# Patient Record
Sex: Female | Born: 1995
Health system: Southern US, Community
[De-identification: ages and names within clinical notes are randomized; demographics above are authoritative.]

## PROBLEM LIST (undated history)

## (undated) DIAGNOSIS — K219 Gastro-esophageal reflux disease without esophagitis: Secondary | ICD-10-CM

## (undated) DIAGNOSIS — E282 Polycystic ovarian syndrome: Secondary | ICD-10-CM

## (undated) DIAGNOSIS — K589 Irritable bowel syndrome without diarrhea: Secondary | ICD-10-CM

## (undated) DIAGNOSIS — B159 Hepatitis A without hepatic coma: Secondary | ICD-10-CM

## (undated) DIAGNOSIS — K9049 Malabsorption due to intolerance, not elsewhere classified: Secondary | ICD-10-CM

## (undated) HISTORY — DX: Gastro-esophageal reflux disease without esophagitis: K21.9

## (undated) HISTORY — DX: Malabsorption due to intolerance, not elsewhere classified: K90.49

## (undated) HISTORY — PX: OTHER SURGICAL HISTORY: SHX169

## (undated) HISTORY — DX: Polycystic ovarian syndrome: E28.2

## (undated) HISTORY — PX: WISDOM TOOTH EXTRACTION: SHX21

## (undated) HISTORY — PX: EYE SURGERY: SHX253

---
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
## 2016-02-11 ENCOUNTER — Encounter: Payer: Self-pay | Admitting: Pediatrics

## 2016-02-11 ENCOUNTER — Ambulatory Visit (INDEPENDENT_AMBULATORY_CARE_PROVIDER_SITE_OTHER): Payer: Commercial Managed Care - HMO | Admitting: Pediatrics

## 2016-02-11 VITALS — BP 116/76 | HR 104 | Temp 97.3°F | Ht 68.04 in | Wt 140.0 lb

## 2016-02-11 DIAGNOSIS — J069 Acute upper respiratory infection, unspecified: Secondary | ICD-10-CM

## 2016-02-11 DIAGNOSIS — J029 Acute pharyngitis, unspecified: Secondary | ICD-10-CM | POA: Diagnosis not present

## 2016-02-11 LAB — RAPID STREP SCREEN (MED CTR MEBANE ONLY): Strep Gp A Ag, IA W/Reflex: NEGATIVE

## 2016-02-11 LAB — CULTURE, GROUP A STREP

## 2016-02-11 NOTE — Progress Notes (Signed)
  Subjective:   Patient ID: Brianna Bridges, female    DOB: 1995-11-17, 21 y.o.   MRN: [id number] CC: Sore Throat  HPI: Brianna Bridges is a 21 y.o. female presenting for Sore Throat  Started getting sick yesterday evening Congestion, sore throat A little coughing No fevers Appetite ok  Relevant past medical, surgical, family and social history reviewed. Allergies and medications reviewed and updated. History  Smoking Status  . Never Smoker  Smokeless Tobacco  . Never Used   ROS: Per HPI   Objective:    BP 116/76   Pulse (!) 104   Temp 97.3 F (36.3 C) (Oral)   Ht 5' 8.04" (1.728 m)   Wt 140 lb (63.5 kg)   BMI 21.26 kg/m   Wt Readings from Last 3 Encounters:  02/11/16 140 lb (63.5 kg) (69 %, Z= 0.49)*  09/02/15 139 lb (63 kg) (69 %, Z= 0.48)*  08/26/15 140 lb (63.5 kg) (70 %, Z= 0.52)*   * Growth percentiles are based on CDC 2-20 Years data.    Gen: NAD, alert, cooperative with exam, NCAT, congested EYES: EOMI, no conjunctival injection, or no icterus ENT:  TMs pearly gray with white scars b/l, OP with mild erythema LYMPH: no cervical LAD CV: NRRR, normal S1/S2, no murmur, distal pulses 2+ b/l Resp: CTABL, no wheezes, normal WOB Neuro: Alert and oriented  Assessment & Plan:  Brianna Bridges was seen today for sore throat.  Diagnoses and all orders for this visit:  Sore throat -     Rapid strep screen (not at [facility]) -     Culture, Group A Strep  Acute URI Rapid strep neg Discussed symptomatic care, return precautions  Follow up plan: prn Assunta Found, MD [place]

## 2016-02-11 NOTE — Patient Instructions (Signed)
Netipot with distilled water 2-3 times a day to clear out sinuses Or Normal saline nasal spray Flonase steroid nasal spray Cetirizine or similar anti-histamine Ibuprofen 600mg  three times a day Lots of fluids

## 2016-02-13 LAB — CULTURE, GROUP A STREP: Strep A Culture: NEGATIVE

## 2016-02-15 DIAGNOSIS — Z6822 Body mass index (BMI) 22.0-22.9, adult: Secondary | ICD-10-CM | POA: Diagnosis not present

## 2016-02-15 DIAGNOSIS — Z01419 Encounter for gynecological examination (general) (routine) without abnormal findings: Secondary | ICD-10-CM | POA: Diagnosis not present

## 2016-04-15 ENCOUNTER — Ambulatory Visit: Payer: Commercial Managed Care - HMO | Admitting: Family

## 2016-04-15 ENCOUNTER — Encounter: Payer: Self-pay | Admitting: Physician Assistant

## 2016-04-15 ENCOUNTER — Ambulatory Visit (INDEPENDENT_AMBULATORY_CARE_PROVIDER_SITE_OTHER): Payer: Commercial Managed Care - HMO | Admitting: Physician Assistant

## 2016-04-15 VITALS — BP 122/78 | HR 86 | Temp 98.5°F | Ht 68.04 in | Wt 142.8 lb

## 2016-04-15 DIAGNOSIS — H6690 Otitis media, unspecified, unspecified ear: Secondary | ICD-10-CM

## 2016-04-15 DIAGNOSIS — H5712 Ocular pain, left eye: Secondary | ICD-10-CM | POA: Diagnosis not present

## 2016-04-15 MED ORDER — FLUTICASONE PROPIONATE 50 MCG/ACT NA SUSP: 2.0000 | Freq: Every day | NASAL | 6 refills | Status: DC

## 2016-04-15 MED ORDER — AMOXICILLIN 500 MG PO CAPS: 1000.0000 mg | ORAL_CAPSULE | Freq: Two times a day (BID) | ORAL | 0 refills | Status: DC

## 2016-04-15 NOTE — Patient Instructions (Signed)
Otitis Media, Adult Otitis media is redness, soreness, and puffiness (swelling) in the space just behind your eardrum (middle ear). It may be caused by allergies or infection. It often happens along with a cold. Follow these instructions at home:  Take your medicine as told. Finish it even if you start to feel better.  Only take over-the-counter or prescription medicines for pain, discomfort, or fever as told by your doctor.  Follow up with your doctor as told. Contact a doctor if:  You have otitis media only in one ear, or bleeding from your nose, or both.  You notice a lump on your neck.  You are not getting better in 3-5 days.  You feel worse instead of better. Get help right away if:  You have pain that is not helped with medicine.  You have puffiness, redness, or pain around your ear.  You get a stiff neck.  You cannot move part of your face (paralysis).  You notice that the bone behind your ear hurts when you touch it. This information is not intended to replace advice given to you by your health care provider. Make sure you discuss any questions you have with your health care provider. Document Released: 07/13/2007 Document Revised: 07/02/2015 Document Reviewed: 08/21/2012 Elsevier Interactive Patient Education  2017 Elsevier Inc.  

## 2016-04-15 NOTE — Progress Notes (Signed)
BP 122/78   Pulse 86   Temp 98.5 F (36.9 C) (Oral)   Ht 5' 8.04" (1.728 m)   Wt 142 lb 12.8 oz (64.8 kg)   BMI 21.69 kg/m    Subjective:    Patient ID: Brianna Bridges, female    DOB: [birth year], 21 y.o.   MRN: [id number]  HPI: Brianna Bridges is a 21 y.o. female presenting on 04/15/2016 for Ear Pain (bilateral)  Almost one week of increasing bilateral ear pain, decreased hearing. Some sinus pressure. Denies nausea, vomiting, fever or chills.  Decreased hearing in both ears too.  Relevant past medical, surgical, family and social history reviewed and updated as indicated. Allergies and medications reviewed and updated.  History reviewed. No pertinent past medical history.  Past Surgical History:  Procedure Laterality Date  . EYE SURGERY    . Foot surgery     Plantars wart  . WISDOM TOOTH EXTRACTION      Review of Systems  Constitutional: Positive for chills and fatigue. Negative for activity change and appetite change.  HENT: Positive for congestion, ear pain, postnasal drip, sore throat and tinnitus. Negative for ear discharge.   Eyes: Negative.   Respiratory: Negative for cough and wheezing.   Cardiovascular: Negative.  Negative for chest pain, palpitations and leg swelling.  Gastrointestinal: Negative.   Genitourinary: Negative.   Musculoskeletal: Negative.   Skin: Negative.   Neurological: Positive for headaches.    Allergies as of 04/15/2016   No Known Allergies     Medication List       Accurate as of 04/15/16  5:23 PM. Always use your most recent med list.          amoxicillin 500 MG capsule Commonly known as:  AMOXIL Take 2 capsules (1,000 mg total) by mouth 2 (two) times daily.   fluticasone 50 MCG/ACT nasal spray Commonly known as:  FLONASE Place 2 sprays into both nostrils daily.   MELODETTA 24 FE 1-20 MG-MCG(24) Chew Generic drug:  Norethin Ace-Eth Estrad-FE          Objective:    BP 122/78   Pulse 86   Temp 98.5 F (36.9 C)  (Oral)   Ht 5' 8.04" (1.728 m)   Wt 142 lb 12.8 oz (64.8 kg)   BMI 21.69 kg/m   No Known Allergies  Physical Exam  Constitutional: She is oriented to person, place, and time. She appears well-developed and well-nourished.  HENT:  Head: Normocephalic and atraumatic.  Right Ear: External ear normal. Tympanic membrane is erythematous. A middle ear effusion is present.  Left Ear: External ear normal. Tympanic membrane is erythematous. A middle ear effusion is present.  Nose: Mucosal edema and rhinorrhea present. Right sinus exhibits no maxillary sinus tenderness. Left sinus exhibits no maxillary sinus tenderness.  Mouth/Throat: Uvula is midline. Posterior oropharyngeal erythema present.  Eyes: Conjunctivae and EOM are normal. Pupils are equal, round, and reactive to light. Right eye exhibits no discharge. Left eye exhibits no discharge.  Neck: Normal range of motion.  Cardiovascular: Normal rate, regular rhythm and normal heart sounds.   Pulmonary/Chest: Effort normal and breath sounds normal. No respiratory distress. She has no wheezes.  Abdominal: Soft.  Lymphadenopathy:    She has no cervical adenopathy.  Neurological: She is alert and oriented to person, place, and time.  Skin: Skin is warm and dry.  Psychiatric: She has a normal mood and affect.  Nursing note and vitals reviewed.       Assessment &  Plan:   1. Acute otitis media, unspecified otitis media type - fluticasone (FLONASE) 50 MCG/ACT nasal spray; Place 2 sprays into both nostrils daily.  Dispense: 16 g; Refill: 6 - amoxicillin (AMOXIL) 500 MG capsule; Take 2 capsules (1,000 mg total) by mouth 2 (two) times daily.  Dispense: 40 capsule; Refill: 0   Continue all other maintenance medications as listed above.  Follow up plan: Return if symptoms worsen or fail to improve.  Educational handout given for otitis media  Terald Sleeper PA-C [facility] [address]  [place], [place]  [address] [phone]   04/15/2016, 5:23 PM

## 2016-05-11 ENCOUNTER — Telehealth: Payer: Self-pay | Admitting: Family

## 2016-05-11 NOTE — Telephone Encounter (Signed)
Printed and faxed

## 2016-05-30 ENCOUNTER — Telehealth: Payer: Self-pay | Admitting: Family

## 2016-05-31 NOTE — Telephone Encounter (Signed)
Shot record faxed to [phone] Attn: Lemmie Evens

## 2016-06-08 ENCOUNTER — Encounter: Payer: Self-pay | Admitting: Family Medicine

## 2016-06-08 ENCOUNTER — Ambulatory Visit (INDEPENDENT_AMBULATORY_CARE_PROVIDER_SITE_OTHER): Payer: Commercial Managed Care - HMO | Admitting: Family Medicine

## 2016-06-08 VITALS — BP 125/78 | HR 97 | Temp 97.4°F | Ht 68.04 in | Wt 147.0 lb

## 2016-06-08 DIAGNOSIS — J329 Chronic sinusitis, unspecified: Secondary | ICD-10-CM | POA: Diagnosis not present

## 2016-06-08 DIAGNOSIS — J40 Bronchitis, not specified as acute or chronic: Secondary | ICD-10-CM

## 2016-06-08 MED ORDER — HYDROCODONE-HOMATROPINE 5-1.5 MG/5ML PO SYRP: 5.0000 mL | ORAL_SOLUTION | Freq: Four times a day (QID) | ORAL | 0 refills | Status: DC | PRN

## 2016-06-08 MED ORDER — BETAMETHASONE SOD PHOS & ACET 6 (3-3) MG/ML IJ SUSP
6.0000 mg | Freq: Once | INTRAMUSCULAR | Status: AC
  Administered 2016-06-08: 6 mg via INTRAMUSCULAR

## 2016-06-08 MED ORDER — LEVOFLOXACIN 500 MG PO TABS: 500.0000 mg | ORAL_TABLET | Freq: Every day | ORAL | 0 refills | Status: DC

## 2016-06-08 NOTE — Progress Notes (Signed)
Subjective:  Patient ID: Brianna Bridges, female    DOB: May 27, 1995  Age: 21 y.o. MRN: [id number]  CC: Cough (pt here today c/o cough and congestion)   HPI CARLISIA GENO presents for Patient presents with upper respiratory congestion. There is moderate sore throat. Patient reports Profuse coughing frequently as well. It is keeping her awake. Estimates 3 hours of sleep a night for the last few nights. Has been given benzonatate and an inhaler recently by a physician near her college. Yellow sputum noted. No fever, chills or sweats. The patient denies being short of breath. Onset was 2 weeks ago. Gradually worsening. Tried OTCs without improvement.   History Akaysha has no past medical history on file.   She has a past surgical history that includes Eye surgery; Foot surgery; and Wisdom tooth extraction.   Her family history is not on file.She reports that she has never smoked. She has never used smokeless tobacco. She reports that she does not drink alcohol or use drugs.  Current Outpatient Prescriptions on File Prior to Visit  Medication Sig Dispense Refill  . MELODETTA 24 FE 1-20 MG-MCG(24) CHEW     . fluticasone (FLONASE) 50 MCG/ACT nasal spray Place 2 sprays into both nostrils daily. (Patient not taking: Reported on 06/08/2016) 16 g 6   No current facility-administered medications on file prior to visit.     ROS Review of Systems  Constitutional: Negative for appetite change, chills, diaphoresis, fatigue and fever.  HENT: Positive for congestion. Negative for ear pain (recent infection cleared with antibiotics prior to onset of these symptoms), hearing loss, postnasal drip, rhinorrhea, sore throat and trouble swallowing.   Respiratory: Positive for cough. Negative for chest tightness and shortness of breath.   Cardiovascular: Negative for chest pain and palpitations.  Gastrointestinal: Negative for abdominal pain.  Musculoskeletal: Negative for arthralgias.  Skin: Negative  for rash.    Objective:  BP 125/78   Pulse 97   Temp 97.4 F (36.3 C) (Oral)   Ht 5' 8.04" (1.728 m)   Wt 147 lb (66.7 kg)   BMI 22.33 kg/m   Physical Exam  Constitutional: She appears well-developed and well-nourished.  HENT:  Head: Normocephalic and atraumatic.  Right Ear: Tympanic membrane and external ear normal. No decreased hearing is noted.  Left Ear: Tympanic membrane and external ear normal. No decreased hearing is noted.  Nose: Mucosal edema present. Right sinus exhibits no frontal sinus tenderness. Left sinus exhibits no frontal sinus tenderness.  Mouth/Throat: No oropharyngeal exudate or posterior oropharyngeal erythema.  Neck: No Brudzinski's sign noted.  Cardiovascular: Normal rate and regular rhythm.   No murmur heard. Pulmonary/Chest: Effort normal. No respiratory distress. She has wheezes.  Lymphadenopathy:       Head (right side): No preauricular adenopathy present.       Head (left side): No preauricular adenopathy present.       Right cervical: No superficial cervical adenopathy present.      Left cervical: No superficial cervical adenopathy present.    Assessment & Plan:   Kylin was seen today for cough.  Diagnoses and all orders for this visit:  Sinobronchitis -     betamethasone acetate-betamethasone sodium phosphate (CELESTONE) injection 6 mg; Inject 1 mL (6 mg total) into the muscle once.  Other orders -     levofloxacin (LEVAQUIN) 500 MG tablet; Take 1 tablet (500 mg total) by mouth daily. -     HYDROcodone-homatropine (HYCODAN) 5-1.5 MG/5ML syrup; Take 5 mLs by mouth every  6 (six) hours as needed for cough.   I have discontinued Ms. Sparling's amoxicillin. I am also having her start on levofloxacin and HYDROcodone-homatropine. Additionally, I am having her maintain her MELODETTA 24 FE and fluticasone. We will continue to administer betamethasone acetate-betamethasone sodium phosphate.  Meds ordered this encounter  Medications  .  levofloxacin (LEVAQUIN) 500 MG tablet    Sig: Take 1 tablet (500 mg total) by mouth daily.    Dispense:  7 tablet    Refill:  0  . HYDROcodone-homatropine (HYCODAN) 5-1.5 MG/5ML syrup    Sig: Take 5 mLs by mouth every 6 (six) hours as needed for cough.    Dispense:  120 mL    Refill:  0  . betamethasone acetate-betamethasone sodium phosphate (CELESTONE) injection 6 mg     Follow-up: Return if symptoms worsen or fail to improve.  Claretta Fraise, M.D.

## 2016-06-27 ENCOUNTER — Ambulatory Visit (INDEPENDENT_AMBULATORY_CARE_PROVIDER_SITE_OTHER): Payer: Commercial Managed Care - HMO | Admitting: *Deleted

## 2016-06-27 DIAGNOSIS — Z111 Encounter for screening for respiratory tuberculosis: Secondary | ICD-10-CM | POA: Diagnosis not present

## 2016-06-27 DIAGNOSIS — Z23 Encounter for immunization: Secondary | ICD-10-CM

## 2016-06-27 NOTE — Progress Notes (Signed)
PPD placed L forearm Pt tolerated well 

## 2016-06-29 LAB — TB SKIN TEST
Induration: 0 mm
TB Skin Test: NEGATIVE

## 2016-07-19 DIAGNOSIS — H1033 Unspecified acute conjunctivitis, bilateral: Secondary | ICD-10-CM | POA: Diagnosis not present

## 2016-07-19 DIAGNOSIS — H10413 Chronic giant papillary conjunctivitis, bilateral: Secondary | ICD-10-CM | POA: Diagnosis not present

## 2016-08-01 DIAGNOSIS — L700 Acne vulgaris: Secondary | ICD-10-CM | POA: Diagnosis not present

## 2016-09-21 ENCOUNTER — Ambulatory Visit (INDEPENDENT_AMBULATORY_CARE_PROVIDER_SITE_OTHER): Payer: 59 | Admitting: Pediatrics

## 2016-09-21 ENCOUNTER — Encounter: Payer: Self-pay | Admitting: Pediatrics

## 2016-09-21 VITALS — BP 120/80 | HR 84 | Temp 99.4°F | Ht 68.04 in | Wt 140.0 lb

## 2016-09-21 DIAGNOSIS — K602 Anal fissure, unspecified: Secondary | ICD-10-CM

## 2016-09-21 NOTE — Progress Notes (Signed)
  Subjective:   Patient ID: Brianna Bridges, female    DOB: [birth year], 21 y.o.   MRN: [id number] CC: Hemorrhoids  HPI: Brianna Bridges is a 21 y.o. female presenting for Hemorrhoids  Started having pain about three months ago Had constipation when younger, had to use suppositories Having pain with passing stool In the last few days has had more                                                               Small spots of blood on toilet paper when she wipes Has pain with passage of stool, feelings like stool "ripping or tearing skin" at times No h/o pregnancy                                                    Relevant past medical, surgical, family and social history reviewed. Allergies and medications reviewed and updated. History  Smoking Status  . Never Smoker  Smokeless Tobacco  . Never Used   ROS: Per HPI   Objective:    BP 120/80   Pulse 84   Temp 99.4 F (37.4 C) (Oral)   Ht 5' 8.04" (1.728 m)   Wt 140 lb (63.5 kg)   BMI 21.26 kg/m   Wt Readings from Last 3 Encounters:  09/21/16 140 lb (63.5 kg)  06/08/16 147 lb (66.7 kg)  04/15/16 142 lb 12.8 oz (64.8 kg)    Gen: NAD, alert, cooperative with exam, NCAT EYES: EOMI, no conjunctival injection, or no icterus CV:  distal pulses 2+ b/l Resp: normal WOB Abd: +BS, soft, NTND. no guarding or organomegaly Ext: No edema, warm Neuro: Alert and oriented MSK: normal muscle bulk Rectal: no external hemorrhoids  Assessment & Plan:  Brianna Bridges was seen today for hemorrhoids.  Diagnoses and all orders for this visit:  Anal fissure Don't take iron pills of birth control pack  Miralax every morning for the next week. Drink all at once. Make sure you are staying hydrated. At least one glass non-caffeinated fluids with each meal and between meals. For every caffeinated beverage drink extra glass of fluid.  Goal 1-2 soft pudding consistency stools in a day  If symptoms continue let us know  Follow up plan: Return if  symptoms worsen or fail to improve. Assunta Found, MD [place]

## 2016-09-21 NOTE — Patient Instructions (Signed)
Don't take iron pills of birth control pack  Miralax every morning for the next week. Drink all at once. Make sure you are staying hydrated. At least one glass non-caffeinated fluids with each meal and between meals. For every caffeinated beverage drink extra glass of fluid.  Goal 1-2 soft pudding consistency stools in a day

## 2016-11-02 DIAGNOSIS — L700 Acne vulgaris: Secondary | ICD-10-CM | POA: Diagnosis not present

## 2016-11-02 DIAGNOSIS — B078 Other viral warts: Secondary | ICD-10-CM | POA: Diagnosis not present

## 2017-01-11 DIAGNOSIS — J069 Acute upper respiratory infection, unspecified: Secondary | ICD-10-CM | POA: Diagnosis not present

## 2017-02-03 DIAGNOSIS — D1800 Hemangioma unspecified site: Secondary | ICD-10-CM | POA: Diagnosis not present

## 2017-02-03 DIAGNOSIS — L814 Other melanin hyperpigmentation: Secondary | ICD-10-CM | POA: Diagnosis not present

## 2017-02-03 DIAGNOSIS — D225 Melanocytic nevi of trunk: Secondary | ICD-10-CM | POA: Diagnosis not present

## 2017-02-23 DIAGNOSIS — J020 Streptococcal pharyngitis: Secondary | ICD-10-CM | POA: Diagnosis not present

## 2017-03-10 DIAGNOSIS — R070 Pain in throat: Secondary | ICD-10-CM | POA: Diagnosis not present

## 2017-03-11 DIAGNOSIS — R070 Pain in throat: Secondary | ICD-10-CM | POA: Diagnosis not present

## 2017-03-29 DIAGNOSIS — R509 Fever, unspecified: Secondary | ICD-10-CM | POA: Diagnosis not present

## 2017-03-29 DIAGNOSIS — J029 Acute pharyngitis, unspecified: Secondary | ICD-10-CM | POA: Diagnosis not present

## 2017-03-29 DIAGNOSIS — R0989 Other specified symptoms and signs involving the circulatory and respiratory systems: Secondary | ICD-10-CM | POA: Diagnosis not present

## 2017-03-29 DIAGNOSIS — J020 Streptococcal pharyngitis: Secondary | ICD-10-CM | POA: Diagnosis not present

## 2017-04-07 DIAGNOSIS — Z6822 Body mass index (BMI) 22.0-22.9, adult: Secondary | ICD-10-CM | POA: Diagnosis not present

## 2017-04-07 DIAGNOSIS — Z01419 Encounter for gynecological examination (general) (routine) without abnormal findings: Secondary | ICD-10-CM | POA: Diagnosis not present

## 2017-04-21 DIAGNOSIS — J039 Acute tonsillitis, unspecified: Secondary | ICD-10-CM | POA: Diagnosis not present

## 2017-04-21 DIAGNOSIS — J029 Acute pharyngitis, unspecified: Secondary | ICD-10-CM | POA: Diagnosis not present

## 2017-05-06 DIAGNOSIS — J020 Streptococcal pharyngitis: Secondary | ICD-10-CM | POA: Diagnosis not present

## 2017-05-06 DIAGNOSIS — J029 Acute pharyngitis, unspecified: Secondary | ICD-10-CM | POA: Diagnosis not present

## 2017-05-24 DIAGNOSIS — J0391 Acute recurrent tonsillitis, unspecified: Secondary | ICD-10-CM

## 2017-05-24 HISTORY — DX: Acute recurrent tonsillitis, unspecified: J03.91

## 2017-06-07 ENCOUNTER — Other Ambulatory Visit: Payer: Self-pay | Admitting: Family Medicine

## 2017-06-07 ENCOUNTER — Telehealth: Payer: Self-pay | Admitting: Family Medicine

## 2017-06-07 DIAGNOSIS — Z1329 Encounter for screening for other suspected endocrine disorder: Secondary | ICD-10-CM

## 2017-06-07 DIAGNOSIS — Z114 Encounter for screening for human immunodeficiency virus [HIV]: Secondary | ICD-10-CM

## 2017-06-07 DIAGNOSIS — Z130 Encounter for screening for diseases of the blood and blood-forming organs and certain disorders involving the immune mechanism: Secondary | ICD-10-CM

## 2017-06-07 DIAGNOSIS — Z13228 Encounter for screening for other metabolic disorders: Secondary | ICD-10-CM

## 2017-06-07 NOTE — Telephone Encounter (Signed)
Pt is scheduled to have CPE on 5/7 with Dr Arnetha Gula, mom has requested we have orders in for pt to come in 5/3 to get labs drawn, mom states that pt has been sickly for last 3 months and a few visits she has and an escalated BP.

## 2017-06-07 NOTE — Telephone Encounter (Signed)
Will place basic labs CMP, CBC, TSH, HIV screening placed.  Difficult to tell what patient will actually need without seeing her first but this should cover most of our bases. Will plan for pap smear at that visit as well.

## 2017-06-08 ENCOUNTER — Other Ambulatory Visit: Payer: 59

## 2017-06-08 DIAGNOSIS — Z13228 Encounter for screening for other metabolic disorders: Secondary | ICD-10-CM

## 2017-06-08 DIAGNOSIS — Z1329 Encounter for screening for other suspected endocrine disorder: Secondary | ICD-10-CM | POA: Diagnosis not present

## 2017-06-08 DIAGNOSIS — Z114 Encounter for screening for human immunodeficiency virus [HIV]: Secondary | ICD-10-CM

## 2017-06-08 DIAGNOSIS — M9901 Segmental and somatic dysfunction of cervical region: Secondary | ICD-10-CM | POA: Diagnosis not present

## 2017-06-08 DIAGNOSIS — Z130 Encounter for screening for diseases of the blood and blood-forming organs and certain disorders involving the immune mechanism: Secondary | ICD-10-CM

## 2017-06-08 DIAGNOSIS — M62830 Muscle spasm of back: Secondary | ICD-10-CM | POA: Diagnosis not present

## 2017-06-08 NOTE — Telephone Encounter (Signed)
Pt's mother notified of lab order

## 2017-06-09 LAB — CBC WITH DIFFERENTIAL/PLATELET
BASOS ABS: 0 10*3/uL (ref 0.0–0.2)
Basos: 0 %
EOS (ABSOLUTE): 0.1 10*3/uL (ref 0.0–0.4)
Eos: 2 %
Hematocrit: 39.3 % (ref 34.0–46.6)
Hemoglobin: 13.1 g/dL (ref 11.1–15.9)
IMMATURE GRANS (ABS): 0 10*3/uL (ref 0.0–0.1)
IMMATURE GRANULOCYTES: 0 %
Lymphocytes Absolute: 1.1 10*3/uL (ref 0.7–3.1)
Lymphs: 18 %
MCH: 28.9 pg (ref 26.6–33.0)
MCHC: 33.3 g/dL (ref 31.5–35.7)
MCV: 87 fL (ref 79–97)
Monocytes Absolute: 0.5 10*3/uL (ref 0.1–0.9)
Monocytes: 8 %
NEUTROS PCT: 72 %
Neutrophils Absolute: 4.4 10*3/uL (ref 1.4–7.0)
Platelets: 276 10*3/uL (ref 150–379)
RBC: 4.54 x10E6/uL (ref 3.77–5.28)
RDW: 13.1 % (ref 12.3–15.4)
WBC: 6.1 10*3/uL (ref 3.4–10.8)

## 2017-06-09 LAB — CMP14+EGFR
ALT: 12 IU/L (ref 0–32)
AST: 17 IU/L (ref 0–40)
Albumin/Globulin Ratio: 1.9 (ref 1.2–2.2)
Albumin: 4.3 g/dL (ref 3.5–5.5)
Alkaline Phosphatase: 34 IU/L — ABNORMAL LOW (ref 39–117)
BUN/Creatinine Ratio: 17 (ref 9–23)
BUN: 14 mg/dL (ref 6–20)
Bilirubin Total: 0.3 mg/dL (ref 0.0–1.2)
CALCIUM: 9.1 mg/dL (ref 8.7–10.2)
CO2: 22 mmol/L (ref 20–29)
Chloride: 104 mmol/L (ref 96–106)
Creatinine, Ser: 0.81 mg/dL (ref 0.57–1.00)
GFR, EST AFRICAN AMERICAN: 120 mL/min/{1.73_m2} (ref >59)
GFR, EST NON AFRICAN AMERICAN: 104 mL/min/{1.73_m2} (ref >59)
GLUCOSE: 88 mg/dL (ref 65–99)
Globulin, Total: 2.3 g/dL (ref 1.5–4.5)
Potassium: 4.1 mmol/L (ref 3.5–5.2)
Sodium: 141 mmol/L (ref 134–144)
TOTAL PROTEIN: 6.6 g/dL (ref 6.0–8.5)

## 2017-06-09 LAB — HIV ANTIBODY (ROUTINE TESTING W REFLEX): HIV SCREEN 4TH GENERATION: NONREACTIVE

## 2017-06-09 LAB — TSH: TSH: 2.29 u[IU]/mL (ref 0.450–4.500)

## 2017-06-12 DIAGNOSIS — M9901 Segmental and somatic dysfunction of cervical region: Secondary | ICD-10-CM | POA: Diagnosis not present

## 2017-06-12 DIAGNOSIS — M62830 Muscle spasm of back: Secondary | ICD-10-CM | POA: Diagnosis not present

## 2017-06-13 ENCOUNTER — Ambulatory Visit (INDEPENDENT_AMBULATORY_CARE_PROVIDER_SITE_OTHER): Payer: 59 | Admitting: Family Medicine

## 2017-06-13 ENCOUNTER — Encounter: Payer: Self-pay | Admitting: Family Medicine

## 2017-06-13 VITALS — BP 130/83 | HR 90 | Temp 98.5°F | Ht 68.0 in | Wt 141.0 lb

## 2017-06-13 DIAGNOSIS — Z0000 Encounter for general adult medical examination without abnormal findings: Secondary | ICD-10-CM

## 2017-06-13 DIAGNOSIS — J301 Allergic rhinitis due to pollen: Secondary | ICD-10-CM

## 2017-06-13 NOTE — Progress Notes (Signed)
Brianna Bridges is a 22 y.o. female presents to office today for annual physical exam examination.    Concerns today include: 1. Allergies Patient reports a 2-week history of dry cough, runny nose.  She denies any fevers, chills, sore throat.  She notes that several of her roommates had similar symptoms but they resolved.  She reports a history of recurrent tonsillitis for which she is seeing ear nose and throat.  She took an allergy pill this morning but is not sure if this is working just yet.  No other therapies tried.  Occupation: Ship broker at [employer], Marital status: single, Substance use: none Diet: balanced, Exercise: regular Last eye exam: 1 year ago; does not require corrective lenses. Last dental exam: q6 months Last pap smear: 03/2017 w. Dr Brianna Bridges. Refills needed today: none Immunizations needed: UTD  No past medical history on file. Social History   Socioeconomic History  . Marital status: Single    Spouse name: Not on file  . Number of children: Not on file  . Years of education: Not on file  . Highest education level: Not on file  Occupational History  . Not on file  Social Needs  . Financial resource strain: Not on file  . Food insecurity:    Worry: Not on file    Inability: Not on file  . Transportation needs:    Medical: Not on file    Non-medical: Not on file  Tobacco Use  . Smoking status: Never Smoker  . Smokeless tobacco: Never Used  Substance and Sexual Activity  . Alcohol use: No  . Drug use: No  . Sexual activity: Not on file  Lifestyle  . Physical activity:    Days per week: Not on file    Minutes per session: Not on file  . Stress: Not on file  Relationships  . Social connections:    Talks on phone: Not on file    Gets together: Not on file    Attends religious service: Not on file    Active member of club or organization: Not on file    Attends meetings of clubs or organizations: Not on file    Relationship status: Not on file  .  Intimate partner violence:    Fear of current or ex partner: Not on file    Emotionally abused: Not on file    Physically abused: Not on file    Forced sexual activity: Not on file  Other Topics Concern  . Not on file  Social History Narrative  . Not on file   Past Surgical History:  Procedure Laterality Date  . EYE SURGERY    . Foot surgery     Plantars wart  . WISDOM TOOTH EXTRACTION     No family history on file.  Current Outpatient Medications:  .  Adapalene-Benzoyl Peroxide 0.1-2.5 % gel, APPLY AS DIRECTED ONCE A DAY, Disp: , Rfl: 3 .  MELODETTA 24 FE 1-20 MG-MCG(24) CHEW, , Disp: , Rfl:   No Known Allergies   ROS: Review of Systems Constitutional: negative Eyes: negative Ears, nose, mouth, throat, and face: positive for nasal congestion and rhinorrhea Respiratory: negative Cardiovascular: negative Gastrointestinal: negative Genitourinary:negative Integument/breast: negative Hematologic/lymphatic: negative Musculoskeletal:negative Neurological: negative Behavioral/Psych: negative Endocrine: negative Allergic/Immunologic: negative    Physical exam BP 130/83   Pulse 90   Temp 98.5 F (36.9 C) (Oral)   Ht 5\' 8"  (1.727 m)   Wt 141 lb (64 kg)   SpO2 98%   BMI  21.44 kg/m  General appearance: alert, cooperative, appears stated age and no distress Head: Normocephalic, without obvious abnormality, atraumatic Eyes: negative findings: lids and lashes normal, conjunctivae and sclerae normal, corneas clear and pupils equal, round, reactive to light and accomodation Ears: normal TM's and external ear canals both ears and scarring noted on bilateral TMs Nose: Nares normal. Septum midline. Mucosa normal. No drainage or sinus tenderness. Throat: lips, mucosa, and tongue normal; teeth and gums normal Neck: no adenopathy, supple, symmetrical, trachea midline and thyroid not enlarged, symmetric, no tenderness/mass/nodules Back: symmetric, no curvature. ROM normal. No CVA  tenderness. Lungs: clear to auscultation bilaterally Heart: regular rate and rhythm, S1, S2 normal, no murmur, click, rub or gallop Abdomen: soft, non-tender; bowel sounds normal; no masses,  no organomegaly Extremities: extremities normal, atraumatic, no cyanosis or edema Pulses: 2+ and symmetric Skin: Skin color, texture, turgor normal. No rashes or lesions Lymph nodes: Cervical, supraclavicular, and axillary nodes normal. Neurologic: Grossly normal  Psych: Mood stable, speech normal, affect appropriate, good eye contact, does not appear to be responding to internal stimuli. Depression screen [facility] 2/9 09/21/2016 06/08/2016 04/15/2016  Decreased Interest 0 0 0  Down, Depressed, Hopeless 0 0 0  PHQ - 2 Score 0 0 0    Assessment/ Plan: Brianna Bridges here for annual physical exam.   1. Well woman exam (no gynecological exam) Pap smear performed by Dr Brianna Bridges at [facility].  Will obtain result from office. TDap due 09/2017.  2. Seasonal allergic rhinitis due to pollen Flonase, Claritin recommended.  No evidence of infection on today's exam.  Home care instructions were reviewed.  Return precautions discussed.  Follow up as needed.  Counseled on healthy lifestyle choices, including diet (rich in fruits, vegetables and lean meats and low in salt and simple carbohydrates) and exercise (at least 30 minutes of moderate physical activity daily).  Patient to follow up in 1 year for annual exam or sooner if needed.  Brianna Goree M. Lajuana Ripple, DO

## 2017-06-13 NOTE — Patient Instructions (Addendum)
I think that your symptoms are related to seasonal allergic rhinitis.  We discussed using a daily antihistamine like Zyrtec or Claritin.  I would also recommend that you use Flonase nasal spray 2 sprays in each nostril daily.  As we discussed, it may take about 2 weeks before you really notice the full effect of these medications.  If you develop any signs or symptoms of infectious etiologies like fevers, chills, more severe sinus symptoms, please do not hesitate to contact me or come in for an appointment.  Plan to see me back in 1 year or sooner if needed.   Preventive Care 18-39 Years, Female Preventive care refers to lifestyle choices and visits with your health care provider that can promote health and wellness. What does preventive care include?  A yearly physical exam. This is also called an annual well check.  Dental exams once or twice a year.  Routine eye exams. Ask your health care provider how often you should have your eyes checked.  Personal lifestyle choices, including: ? Daily care of your teeth and gums. ? Regular physical activity. ? Eating a healthy diet. ? Avoiding tobacco and drug use. ? Limiting alcohol use. ? Practicing safe sex. ? Taking vitamin and mineral supplements as recommended by your health care provider. What happens during an annual well check? The services and screenings done by your health care provider during your annual well check will depend on your age, overall health, lifestyle risk factors, and family history of disease. Counseling Your health care provider may ask you questions about your:  Alcohol use.  Tobacco use.  Drug use.  Emotional well-being.  Home and relationship well-being.  Sexual activity.  Eating habits.  Work and work Statistician.  Method of birth control.  Menstrual cycle.  Pregnancy history.  Screening You may have the following tests or measurements:  Height, weight, and BMI.  Diabetes screening. This is  done by checking your blood sugar (glucose) after you have not eaten for a while (fasting).  Blood pressure.  Lipid and cholesterol levels. These may be checked every 5 years starting at age 76.  Skin check.  Hepatitis C blood test.  Hepatitis B blood test.  Sexually transmitted disease (STD) testing.  BRCA-related cancer screening. This may be done if you have a family history of breast, ovarian, tubal, or peritoneal cancers.  Pelvic exam and Pap test. This may be done every 3 years starting at age 51. Starting at age 65, this may be done every 5 years if you have a Pap test in combination with an HPV test.  Discuss your test results, treatment options, and if necessary, the need for more tests with your health care provider. Vaccines Your health care provider may recommend certain vaccines, such as:  Influenza vaccine. This is recommended every year.  Tetanus, diphtheria, and acellular pertussis (Tdap, Td) vaccine. You may need a Td booster every 10 years.  Varicella vaccine. You may need this if you have not been vaccinated.  HPV vaccine. If you are 22 or younger, you may need three doses over 6 months.  Measles, mumps, and rubella (MMR) vaccine. You may need at least one dose of MMR. You may also need a second dose.  Pneumococcal 13-valent conjugate (PCV13) vaccine. You may need this if you have certain conditions and were not previously vaccinated.  Pneumococcal polysaccharide (PPSV23) vaccine. You may need one or two doses if you smoke cigarettes or if you have certain conditions.  Meningococcal vaccine. One  dose is recommended if you are age 90-21 years and a first-year college student living in a residence hall, or if you have one of several medical conditions. You may also need additional booster doses.  Hepatitis A vaccine. You may need this if you have certain conditions or if you travel or work in places where you may be exposed to hepatitis A.  Hepatitis B  vaccine. You may need this if you have certain conditions or if you travel or work in places where you may be exposed to hepatitis B.  Haemophilus influenzae type b (Hib) vaccine. You may need this if you have certain risk factors.  Talk to your health care provider about which screenings and vaccines you need and how often you need them. This information is not intended to replace advice given to you by your health care provider. Make sure you discuss any questions you have with your health care provider. Document Released: 03/22/2001 Document Revised: 10/14/2015 Document Reviewed: 11/25/2014 Elsevier Interactive Patient Education  [employer].

## 2017-06-19 DIAGNOSIS — M62830 Muscle spasm of back: Secondary | ICD-10-CM | POA: Diagnosis not present

## 2017-06-19 DIAGNOSIS — M9901 Segmental and somatic dysfunction of cervical region: Secondary | ICD-10-CM | POA: Diagnosis not present

## 2017-06-22 DIAGNOSIS — M62830 Muscle spasm of back: Secondary | ICD-10-CM | POA: Diagnosis not present

## 2017-06-22 DIAGNOSIS — M9901 Segmental and somatic dysfunction of cervical region: Secondary | ICD-10-CM | POA: Diagnosis not present

## 2017-07-06 DIAGNOSIS — N6002 Solitary cyst of left breast: Secondary | ICD-10-CM | POA: Diagnosis not present

## 2017-07-06 DIAGNOSIS — N644 Mastodynia: Secondary | ICD-10-CM | POA: Diagnosis not present

## 2017-09-14 ENCOUNTER — Ambulatory Visit: Payer: 59

## 2017-09-18 ENCOUNTER — Ambulatory Visit (INDEPENDENT_AMBULATORY_CARE_PROVIDER_SITE_OTHER): Payer: 59 | Admitting: *Deleted

## 2017-09-18 ENCOUNTER — Ambulatory Visit: Payer: 59 | Admitting: Family Medicine

## 2017-09-18 ENCOUNTER — Encounter: Payer: Self-pay | Admitting: Family Medicine

## 2017-09-18 VITALS — BP 117/77 | HR 73 | Temp 97.3°F | Ht 68.0 in | Wt 142.0 lb

## 2017-09-18 DIAGNOSIS — Z23 Encounter for immunization: Secondary | ICD-10-CM

## 2017-09-18 DIAGNOSIS — B078 Other viral warts: Secondary | ICD-10-CM | POA: Diagnosis not present

## 2017-09-18 DIAGNOSIS — Z111 Encounter for screening for respiratory tuberculosis: Secondary | ICD-10-CM

## 2017-09-18 NOTE — Progress Notes (Signed)
Pt given PPD R forearm Tolerated well

## 2017-09-18 NOTE — Progress Notes (Signed)
Subjective: CC: warts PCP: Janora Norlander, DO VQQ:[id number] D Diffee is a 22 y.o. female presenting to clinic today for:  1. Warts Patient reports a long-standing history of issues with warts on her knees.  She saw her dermatologist last year who removed 1 of the right knee.  She notes that she had another one develop over the last several months on the right knee as well as 2 smaller ones on the left knee.  Denies any spontaneous bleeding.    ROS: Per HPI  No Known Allergies No past medical history on file.  Current Outpatient Medications:  Marland Kitchen  MELODETTA 24 FE 1-20 MG-MCG(24) CHEW, , Disp: , Rfl:  Social History   Socioeconomic History  . Marital status: Single    Spouse name: Not on file  . Number of children: Not on file  . Years of education: Not on file  . Highest education level: Not on file  Occupational History  . Not on file  Social Needs  . Financial resource strain: Not on file  . Food insecurity:    Worry: Not on file    Inability: Not on file  . Transportation needs:    Medical: Not on file    Non-medical: Not on file  Tobacco Use  . Smoking status: Never Smoker  . Smokeless tobacco: Never Used  Substance and Sexual Activity  . Alcohol use: No  . Drug use: No  . Sexual activity: Not on file  Lifestyle  . Physical activity:    Days per week: Not on file    Minutes per session: Not on file  . Stress: Not on file  Relationships  . Social connections:    Talks on phone: Not on file    Gets together: Not on file    Attends religious service: Not on file    Active member of club or organization: Not on file    Attends meetings of clubs or organizations: Not on file    Relationship status: Not on file  . Intimate partner violence:    Fear of current or ex partner: Not on file    Emotionally abused: Not on file    Physically abused: Not on file    Forced sexual activity: Not on file  Other Topics Concern  . Not on file  Social History Narrative   . Not on file   Family History  Problem Relation Age of Onset  . Hyperlipidemia Father     Objective: Office vital signs reviewed. BP 117/77   Pulse 73   Temp (!) 97.3 F (36.3 C) (Oral)   Ht 5\' 8"  (1.727 m)   Wt 142 lb (64.4 kg)   BMI 21.59 kg/m   Physical Examination:  General: Awake, alert, well nourished, No acute distress Extremities: warm, well perfused, No edema, cyanosis or clubbing; +2 pulses bilaterally Skin: dry; intact; scaly, wartlike lesion appreciated along the right anterior knee.  It is approximately 3 mm x 3 mm in size.  She has 2 much smaller and flatter lesions appreciated on the left anterior knee that are about 1 mm x 1 mm in size each.  Cryotherapy Procedure:  Risks and benefits of procedure were reviewed with the patient.  Written consent obtained and scanned into the chart.  Lesions of concern was identified and located on left knee.  Liquid nitrogen was applied to area of concern and extending out 0.5 millimeters beyond the border of the lesion.  Treated area was allowed to come  back to room temperature before treating it a second time.  Patient tolerated procedure well and there were no immediate complications.  Home care instructions were reviewed with the patient and a handout was provided.  Cryotherapy Procedure:  Risks and benefits of procedure were reviewed with the patient.  Written consent obtained and scanned into the chart.  Lesion of concern was identified and located on right knee.  Liquid nitrogen was applied to area of concern and extending out 0.5 millimeters beyond the border of the lesion.  Treated area was allowed to come back to room temperature before treating it a second time.  Patient tolerated procedure well and there were no immediate complications.  Home care instructions were reviewed with the patient and a handout was provided.   Assessment/ Plan: 22 y.o. female   1. Other viral warts Risks and benefits reviewed.  Signed  consent obtained.  Treated with cryotherapy today.  See above procedures.  Patient tolerated procedure well.  No immediate comp occasions.  Home care instructions were reviewed with the patient and handout was provided.  I also gave her handout on duct tape therapy.  Follow-up as needed.  2. Need for Tdap vaccination Tdap updated today.  Follow-up as needed.   Orders Placed This Encounter  Procedures  . Tdap vaccine greater than or equal to 7yo IM    Janora Norlander, DO [place] [phone]

## 2017-09-18 NOTE — Patient Instructions (Signed)
Cryosurgery for Skin Conditions, Care After This sheet gives you information about how to care for yourself after your procedure. Your health care provider may also give you more specific instructions. If you have problems or questions, contact your health care provider. What can I expect after the procedure? After your procedure, it is common to have redness, swelling, and a blister that forms over the treated area. The blister may contain a small amount of blood. After about 2 weeks, the blister will break on its own, leaving a scab. Then the treated area will heal. After healing, there is usually little or no scarring. Follow these instructions at home: Caring for the treated area  Follow instructions from your health care provider about how to take care of the treated area. Make sure you: ? Keep the area covered with a bandage (dressing) until it heals, or for as long as told by your health care provider. ? Wash your hands with soap and water before you change your dressing. If soap and water are not available, use hand sanitizer. ? Change your dressing as told by your health care provider. ? Keep the dressing and the treated area clean and dry. If the dressing gets wet, change it right away. ? Clean the treated area with soap and water.  Check the treated area every day for signs of infection. Check for: ? More redness, swelling, or pain. ? More fluid or blood. ? Warmth. ? Pus or a bad smell. General instructions  Do not pick at your blister or try to break it open. This can cause infection and scarring.  Do not apply any medicine, cream, or lotion to the treated area unless directed by your health care provider.  Take over-the-counter and prescription medicines only as told by your health care provider.  Keep all follow-up visits as told by your health care provider. This is important. Contact a health care provider if:  You have more redness, swelling, or pain around the treated  area.  You have more fluid or blood coming from the treated area.  The treated area feels warm to the touch.  You have pus or a bad smell coming from the treated area.  Your blister becomes large and painful. Get help right away if:  You have a fever and have redness spreading from the treated area. Summary  The treated area will become red and swollen shortly after the procedure.  You should keep the treated area and your dressing clean and dry.  Check the treated area every day for signs of infection, such as fluid, pus, warmth, or having more redness, swelling, or pain.  Do not pick at your blister or try to break it open. This information is not intended to replace advice given to you by your health care provider. Make sure you discuss any questions you have with your health care provider. Document Released: 08/13/2004 Document Revised: 12/14/2015 Document Reviewed: 12/14/2015 Elsevier Interactive Patient Education  2017 Elsevier Inc.  

## 2017-09-20 LAB — TB SKIN TEST: TB Skin Test: NEGATIVE

## 2017-10-25 DIAGNOSIS — J029 Acute pharyngitis, unspecified: Secondary | ICD-10-CM | POA: Diagnosis not present

## 2017-10-25 DIAGNOSIS — R0989 Other specified symptoms and signs involving the circulatory and respiratory systems: Secondary | ICD-10-CM | POA: Diagnosis not present

## 2017-10-25 DIAGNOSIS — R509 Fever, unspecified: Secondary | ICD-10-CM | POA: Diagnosis not present

## 2017-11-09 DIAGNOSIS — J3501 Chronic tonsillitis: Secondary | ICD-10-CM | POA: Diagnosis not present

## 2017-11-09 DIAGNOSIS — M62830 Muscle spasm of back: Secondary | ICD-10-CM | POA: Diagnosis not present

## 2017-11-09 DIAGNOSIS — M9901 Segmental and somatic dysfunction of cervical region: Secondary | ICD-10-CM | POA: Diagnosis not present

## 2017-12-08 ENCOUNTER — Ambulatory Visit (INDEPENDENT_AMBULATORY_CARE_PROVIDER_SITE_OTHER): Payer: 59 | Admitting: *Deleted

## 2017-12-08 DIAGNOSIS — N926 Irregular menstruation, unspecified: Secondary | ICD-10-CM | POA: Diagnosis not present

## 2017-12-08 DIAGNOSIS — Z23 Encounter for immunization: Secondary | ICD-10-CM | POA: Diagnosis not present

## 2017-12-13 DIAGNOSIS — R070 Pain in throat: Secondary | ICD-10-CM | POA: Diagnosis not present

## 2017-12-13 DIAGNOSIS — J069 Acute upper respiratory infection, unspecified: Secondary | ICD-10-CM | POA: Diagnosis not present

## 2017-12-22 DIAGNOSIS — E282 Polycystic ovarian syndrome: Secondary | ICD-10-CM | POA: Diagnosis not present

## 2017-12-22 DIAGNOSIS — N920 Excessive and frequent menstruation with regular cycle: Secondary | ICD-10-CM | POA: Diagnosis not present

## 2018-01-03 DIAGNOSIS — M9901 Segmental and somatic dysfunction of cervical region: Secondary | ICD-10-CM | POA: Diagnosis not present

## 2018-01-03 DIAGNOSIS — M62830 Muscle spasm of back: Secondary | ICD-10-CM | POA: Diagnosis not present

## 2018-02-01 DIAGNOSIS — Z23 Encounter for immunization: Secondary | ICD-10-CM | POA: Diagnosis not present

## 2018-02-01 DIAGNOSIS — D225 Melanocytic nevi of trunk: Secondary | ICD-10-CM | POA: Diagnosis not present

## 2018-02-01 DIAGNOSIS — L814 Other melanin hyperpigmentation: Secondary | ICD-10-CM | POA: Diagnosis not present

## 2018-04-09 DIAGNOSIS — Z01419 Encounter for gynecological examination (general) (routine) without abnormal findings: Secondary | ICD-10-CM | POA: Diagnosis not present

## 2018-04-09 DIAGNOSIS — E282 Polycystic ovarian syndrome: Secondary | ICD-10-CM | POA: Diagnosis not present

## 2018-04-09 DIAGNOSIS — Z6825 Body mass index (BMI) 25.0-25.9, adult: Secondary | ICD-10-CM | POA: Diagnosis not present

## 2018-05-30 DIAGNOSIS — M9901 Segmental and somatic dysfunction of cervical region: Secondary | ICD-10-CM | POA: Diagnosis not present

## 2018-05-30 DIAGNOSIS — M62830 Muscle spasm of back: Secondary | ICD-10-CM | POA: Diagnosis not present

## 2018-05-31 DIAGNOSIS — M62830 Muscle spasm of back: Secondary | ICD-10-CM | POA: Diagnosis not present

## 2018-05-31 DIAGNOSIS — M9901 Segmental and somatic dysfunction of cervical region: Secondary | ICD-10-CM | POA: Diagnosis not present

## 2018-11-05 ENCOUNTER — Encounter (INDEPENDENT_AMBULATORY_CARE_PROVIDER_SITE_OTHER): Payer: Self-pay | Admitting: *Deleted

## 2018-11-05 ENCOUNTER — Ambulatory Visit (INDEPENDENT_AMBULATORY_CARE_PROVIDER_SITE_OTHER): Payer: 59 | Admitting: Internal Medicine

## 2018-11-05 ENCOUNTER — Encounter (INDEPENDENT_AMBULATORY_CARE_PROVIDER_SITE_OTHER): Payer: Self-pay | Admitting: Internal Medicine

## 2018-11-05 ENCOUNTER — Other Ambulatory Visit: Payer: Self-pay

## 2018-11-05 DIAGNOSIS — K529 Noninfective gastroenteritis and colitis, unspecified: Secondary | ICD-10-CM | POA: Diagnosis not present

## 2018-11-05 DIAGNOSIS — R945 Abnormal results of liver function studies: Secondary | ICD-10-CM | POA: Insufficient documentation

## 2018-11-05 DIAGNOSIS — R7989 Other specified abnormal findings of blood chemistry: Secondary | ICD-10-CM | POA: Insufficient documentation

## 2018-11-05 HISTORY — DX: Other specified abnormal findings of blood chemistry: R79.89

## 2018-11-05 MED ORDER — DICYCLOMINE HCL 10 MG PO CAPS: 10.0000 mg | ORAL_CAPSULE | Freq: Three times a day (TID) | ORAL | 1 refills | Status: DC

## 2018-11-05 NOTE — Patient Instructions (Signed)
Physician will call with results of ultrasound when completed. Blood work to be done next week.

## 2018-11-05 NOTE — Progress Notes (Signed)
Reason for consultation  Elevated transaminases.  History of present illness  Brianna Bridges is a 23 year old Caucasian female patient of Dr. Lorin Glass who is here for evaluation of elevated transaminases.  She has no history of hepatitis or jaundice.  LFTs from May 2019 revealed AST of 17 and ALT of 12.  She had blood work on 10/22/2018.  AST was 100 and ALT was 182.  Bilirubin was 0.5 and AP was 59. Patient was advised to discontinue spironolactone which she stopped on 10/30/2018.  Patient started this medication on 08/05/2018 for acne and it has helped her.  Patient states she has been on birth control pills for more than a year.  She has been on her current preparation since March 2020. She states she had hepatitis A and B vaccination while in middle school.  She had hepatitis B surface antibody in August 2016 and antibody titer was low.  She therefore received 3 shots of hep B vaccine.  Patient says her appetite is good.  She does complain of lower abdominal cramping after meals and is usually followed by loose stools.  On average she has 2-3 stools per day.  On rare occasion she may has as many as 8 stools per day.  She denies melena or rectal bleeding.  She also complains of nausea and belching and at times she feels as if her food stays in her upper chest.  However she does not have heartburn or dysphagia.  She recalls that she had constipation as a child and had to be given fleets enemas.  However she did not have any problem for 15 years.  Her periods are irregular. She drinks alcohol occasionally.  She does not smoke cigarettes.  She is single and does not have any children.  She is sexually not active.  She is going to grad school at [employer] with majoring social work. She does not have any tattoos.   Current Medications: Outpatient Encounter Medications as of 11/05/2018  Medication Sig  . drospirenone-ethinyl estradiol (YASMIN) 3-0.03 MG tablet Take 1 tablet by mouth daily.  Marland Kitchen  tretinoin (RETIN-A) 0.025 % cream APPLY TOPICALLY IN THE EVENING TO FACE ONCE A DAY EXTERNALLY 30 DAYS  . [DISCONTINUED] MELODETTA 24 FE 1-20 MG-MCG(24) CHEW    No facility-administered encounter medications on file as of 11/05/2018.    Past medical history  Polycystic ovary syndrome Acne. History of recurrent tonsillitis.  Allergies  No Known Allergies  Family history  Mother is 3 and has GERD.  Father is 51 and has hyperlipidemia otherwise in good health.  She has 1 sister age 34 in good health.   Social history  Social history reviewed under history of present illness.   Physical examination Blood pressure 121/87, pulse 82, temperature 98.8 F (37.1 C), temperature source Oral, height 5' 7.5" (1.715 m), weight 142 lb 12.8 oz (64.8 kg), last menstrual period 10/24/2018. Patient is alert and in no acute distress. Conjunctiva is pink. Sclera is nonicteric Oropharyngeal mucosa is normal. No neck masses or thyromegaly noted. Cardiac exam with regular rhythm normal S1 and S2. No murmur or gallop noted. Lungs are clear to auscultation. Abdomen is symmetrical.  Bowel sounds are normal.  On palpation abdomen is soft.  She has mild tenderness in right lower quadrant.  No guarding.  Liver edge is indistinct below RCM.  [place] soft.  No masses or splenomegaly. No LE edema or clubbing noted. No skin rash noted.  Labs/studies Results:  Lab data from 10/22/2018 Glucose 86  BUN 12 and creatinine 0.90 Serum sodium 140, potassium 4.2, chloride 102, CO2 23 Serum calcium 9.4. Bilirubin 0.5, AP 59, AST 100, ALT 182 total protein 6.8 and albumin of 3.9.  Lab data from 06/08/2017 AST 17 and ALT 12.   Assessment:  #1.  Mildly elevated transaminases with ALT greater than AST and a 23 year old female who is on oral contraceptive for polycystic ovary syndrome and who has been on low-dose spironolactone for about 3 months.  Spironolactone was discontinued 6 days ago.  No history of  elevated transaminases in the past.  It appears she has received hepatitis B vaccination while in middle school and again about 4 years ago when antibody testing revealed her to be nonimmune. Family history is negative for chronic liver disease. She possibly has drug-induced hepatic injury.  Hepatic injury can occur both with oral contraceptives and spironolactone but it is not that common.  Given she has symptoms pertaining to upper GI tract need to rule out symptomatic cholelithiasis.  #2.  Postprandial diarrhea with lower abdominal cramping most likely due to IBS.  However IBD remains in differential diagnosis but index of suspicion low.   Recommendations  Right upper quadrant abdominal ultrasound. Patient will go to the lab for CBC with differential, LFTs, sed rate, hepatitis B surface antigen, hepatitis B surface antibody, hepatitis A antibody total as well as hep C antibody. Dicyclomine 10 mg by mouth 30 minutes before each meal. Further recommendations to follow. Office visit in 8 weeks.

## 2018-11-07 ENCOUNTER — Encounter (INDEPENDENT_AMBULATORY_CARE_PROVIDER_SITE_OTHER): Payer: Self-pay

## 2018-11-09 ENCOUNTER — Ambulatory Visit (HOSPITAL_COMMUNITY)
Admission: RE | Admit: 2018-11-09 | Discharge: 2018-11-09 | Disposition: A | Discharge status: Home / Self Care | Payer: 59 | Source: Ambulatory Visit | Attending: Internal Medicine | Admitting: Internal Medicine

## 2018-11-09 ENCOUNTER — Other Ambulatory Visit: Payer: Self-pay

## 2018-11-09 DIAGNOSIS — R945 Abnormal results of liver function studies: Secondary | ICD-10-CM | POA: Insufficient documentation

## 2018-11-12 ENCOUNTER — Other Ambulatory Visit (INDEPENDENT_AMBULATORY_CARE_PROVIDER_SITE_OTHER): Payer: Self-pay | Admitting: *Deleted

## 2018-11-12 ENCOUNTER — Other Ambulatory Visit: Payer: 59

## 2018-11-12 ENCOUNTER — Other Ambulatory Visit: Payer: Self-pay

## 2018-11-12 ENCOUNTER — Telehealth (INDEPENDENT_AMBULATORY_CARE_PROVIDER_SITE_OTHER): Payer: Self-pay | Admitting: Internal Medicine

## 2018-11-12 DIAGNOSIS — R7989 Other specified abnormal findings of blood chemistry: Secondary | ICD-10-CM

## 2018-11-12 DIAGNOSIS — R945 Abnormal results of liver function studies: Secondary | ICD-10-CM

## 2018-11-12 NOTE — Telephone Encounter (Signed)
This has been done and the new orders have been faxed to [facility].

## 2018-11-12 NOTE — Telephone Encounter (Signed)
[place] called stated her lab request needs to be changed to [facility]

## 2018-11-13 LAB — HEPATITIS B SURFACE ANTIGEN: Hepatitis B Surface Ag: NEGATIVE

## 2018-11-13 LAB — HEPATIC FUNCTION PANEL
ALT: 57 IU/L — ABNORMAL HIGH (ref 0–32)
AST: 26 IU/L (ref 0–40)
Albumin: 4 g/dL (ref 3.9–5.0)
Alkaline Phosphatase: 39 IU/L (ref 39–117)
Bilirubin Total: 0.4 mg/dL (ref 0.0–1.2)
Bilirubin, Direct: 0.12 mg/dL (ref 0.00–0.40)
Total Protein: 6.5 g/dL (ref 6.0–8.5)

## 2018-11-13 LAB — CBC WITH DIFFERENTIAL/PLATELET
Basophils Absolute: 0 10*3/uL (ref 0.0–0.2)
Basos: 1 %
EOS (ABSOLUTE): 0 10*3/uL (ref 0.0–0.4)
Eos: 1 %
Hematocrit: 39.4 % (ref 34.0–46.6)
Hemoglobin: 13.1 g/dL (ref 11.1–15.9)
Immature Grans (Abs): 0 10*3/uL (ref 0.0–0.1)
Immature Granulocytes: 0 %
Lymphocytes Absolute: 2.2 10*3/uL (ref 0.7–3.1)
Lymphs: 53 %
MCH: 28.7 pg (ref 26.6–33.0)
MCHC: 33.2 g/dL (ref 31.5–35.7)
MCV: 86 fL (ref 79–97)
Monocytes Absolute: 0.4 10*3/uL (ref 0.1–0.9)
Monocytes: 9 %
Neutrophils Absolute: 1.5 10*3/uL (ref 1.4–7.0)
Neutrophils: 36 %
Platelets: 226 10*3/uL (ref 150–450)
RBC: 4.57 x10E6/uL (ref 3.77–5.28)
RDW: 12.6 % (ref 11.7–15.4)
WBC: 4.2 10*3/uL (ref 3.4–10.8)

## 2018-11-13 LAB — HEPATITIS A ANTIBODY, TOTAL: hep A Total Ab: POSITIVE — AB

## 2018-11-13 LAB — HEPATITIS B SURFACE ANTIBODY,QUALITATIVE: Hep B Surface Ab, Qual: REACTIVE

## 2018-11-13 LAB — SEDIMENTATION RATE: Sed Rate: 16 mm/hr (ref 0–32)

## 2018-11-13 LAB — HEPATITIS C ANTIBODY: Hep C Virus Ab: <0.1 s/co ratio (ref 0.0–0.9)

## 2018-11-15 ENCOUNTER — Telehealth (INDEPENDENT_AMBULATORY_CARE_PROVIDER_SITE_OTHER): Payer: Self-pay | Admitting: *Deleted

## 2018-11-15 NOTE — Telephone Encounter (Signed)
Her Mom, Lemmie Evens,  (we have permission to speak to her) had left a message about getting her results etc.  She has a question about her Hep A + and would like someone to call her and explain what it is etc.  We are referring her for surgery on GB. Lelon Frohlich is waiting on when they want to proceed with that.  The labs are her only concern.   Deserai Mcdowall  [phone]  Thank you

## 2018-11-20 ENCOUNTER — Other Ambulatory Visit (INDEPENDENT_AMBULATORY_CARE_PROVIDER_SITE_OTHER): Payer: Self-pay | Admitting: *Deleted

## 2018-11-20 DIAGNOSIS — R7989 Other specified abnormal findings of blood chemistry: Secondary | ICD-10-CM

## 2018-11-20 DIAGNOSIS — R945 Abnormal results of liver function studies: Secondary | ICD-10-CM

## 2018-11-21 ENCOUNTER — Encounter: Payer: Self-pay | Admitting: Family Medicine

## 2018-11-21 ENCOUNTER — Other Ambulatory Visit: Payer: Self-pay

## 2018-11-21 ENCOUNTER — Ambulatory Visit (INDEPENDENT_AMBULATORY_CARE_PROVIDER_SITE_OTHER): Payer: 59 | Admitting: Family Medicine

## 2018-11-21 DIAGNOSIS — J387 Other diseases of larynx: Secondary | ICD-10-CM | POA: Diagnosis not present

## 2018-11-21 NOTE — Progress Notes (Signed)
Telephone visit  Subjective: CC: Throat spots PCP: Janora Norlander, DO [id number] Brianna Bridges is a 23 y.o. female calls for telephone consult today. Patient provides verbal consent for consult held via phone.  Location of patient: home Location of provider: [facility] Others present for call: none  1. Throat spots Patient reports recurrent strep throat.  Last episode was last year, with 6+ episodes the year prior.  She reports that 9/20 her tonsils were covered with white patches that are getting better but still present.  She now has a red spot in her throat that is on the left side of her throat.  She denies sore throat.  She has some irritation and globus sensation.  No tonsil stones but has history of this.  Tonsils are not enlarged.  She has nausea last week but is unsure if related to IBS.  She is seeing a GI for this.  No rash, fatigue.  She has a mild headache yesterday.  Denies any enlarged lymph nodes.   ROS: Per HPI  No Known Allergies Past Medical History:  Diagnosis Date  . GERD (gastroesophageal reflux disease)   . Polycystic ovary syndrome     Current Outpatient Medications:  .  dicyclomine (BENTYL) 10 MG capsule, Take 1 capsule (10 mg total) by mouth 3 (three) times daily before meals., Disp: 90 capsule, Rfl: 1 .  drospirenone-ethinyl estradiol (YASMIN) 3-0.03 MG tablet, Take 1 tablet by mouth daily., Disp: , Rfl:  .  tretinoin (RETIN-A) 0.025 % cream, APPLY TOPICALLY IN THE EVENING TO FACE ONCE A DAY EXTERNALLY 30 DAYS, Disp: , Rfl:   Mouth: She has a small, shallow, erythematous erosion noted on the left side of the oropharynx.  Tonsils do not appear enlarged.  She has a few white spots noted within the tonsils but these appear to be consistent with early tonsil stones and do not appear to be infectious in etiology.  Assessment/ Plan: 23 y.o. female   1. Throat ulcer The photograph that she sent appears to be an erosion of the posterior oropharynx.  She is not  symptomatic and therefore I have not recommended any treatment.  We discussed that this should self heal.  If it is related to uncontrolled reflux, may need to consider PPI.  She is currently under the care of gastroenterology I have asked that she bring this to his attention if it does not go away.  She voiced great appreciation for the consult and will contact me if any questions or concerns arise.   Start time: 5:41pm End time: 5:52pm  Total time spent on patient care (including telephone call/ virtual visit): 15 minutes  [place], [place] [phone]

## 2018-11-27 ENCOUNTER — Ambulatory Visit: Payer: 59 | Admitting: General Surgery

## 2018-11-30 ENCOUNTER — Encounter (INDEPENDENT_AMBULATORY_CARE_PROVIDER_SITE_OTHER): Payer: Self-pay

## 2018-12-03 ENCOUNTER — Other Ambulatory Visit (INDEPENDENT_AMBULATORY_CARE_PROVIDER_SITE_OTHER): Payer: Self-pay | Admitting: Internal Medicine

## 2018-12-04 ENCOUNTER — Ambulatory Visit: Payer: 59 | Admitting: General Surgery

## 2018-12-04 ENCOUNTER — Other Ambulatory Visit: Payer: Self-pay

## 2018-12-04 ENCOUNTER — Encounter: Payer: Self-pay | Admitting: General Surgery

## 2018-12-04 VITALS — BP 136/84 | HR 97 | Temp 98.3°F | Resp 16 | Ht 67.5 in | Wt 142.0 lb

## 2018-12-04 DIAGNOSIS — K8020 Calculus of gallbladder without cholecystitis without obstruction: Secondary | ICD-10-CM | POA: Diagnosis not present

## 2018-12-04 DIAGNOSIS — R945 Abnormal results of liver function studies: Secondary | ICD-10-CM

## 2018-12-04 DIAGNOSIS — R7989 Other specified abnormal findings of blood chemistry: Secondary | ICD-10-CM

## 2018-12-04 NOTE — Patient Instructions (Addendum)
Laparoscopic Cholecystectomy Laparoscopic cholecystectomy is surgery to remove the gallbladder. The gallbladder is a pear-shaped organ that lies beneath the liver on the right side of the body. The gallbladder stores bile, which is a fluid that helps the body to digest fats. Cholecystectomy is often done for inflammation of the gallbladder (cholecystitis). This condition is usually caused by a buildup of gallstones (cholelithiasis) in the gallbladder. Gallstones can block the flow of bile, which can result in inflammation and pain. In severe cases, emergency surgery may be required. This procedure is done though small incisions in your abdomen (laparoscopic surgery). A thin scope with a camera (laparoscope) is inserted through one incision. Thin surgical instruments are inserted through the other incisions. In some cases, a laparoscopic procedure may be turned into a type of surgery that is done through a larger incision (open surgery). Tell a health care provider about:  Any allergies you have.  All medicines you are taking, including vitamins, herbs, eye drops, creams, and over-the-counter medicines.  Any problems you or family members have had with anesthetic medicines.  Any blood disorders you have.  Any surgeries you have had.  Any medical conditions you have.  Whether you are pregnant or may be pregnant. What are the risks? Generally, this is a safe procedure. However, problems may occur, including:  Infection.  Bleeding.  Allergic reactions to medicines.  Damage to other structures or organs.  A stone remaining in the common bile duct. The common bile duct carries bile from the gallbladder into the small intestine.  A bile leak from the cyst duct that is clipped when your gallbladder is removed. Medicines  Ask your health care provider about: ? Changing or stopping your regular medicines. This is especially important if you are taking diabetes medicines or blood thinners. ?  Taking medicines such as aspirin and ibuprofen. These medicines can thin your blood. Do not take these medicines before your procedure if your health care provider instructs you not to.  You may be given antibiotic medicine to help prevent infection. General instructions  Let your health care provider know if you develop a cold or an infection before surgery.  Plan to have someone take you home from the hospital or clinic.  Ask your health care provider how your surgical site will be marked or identified. What happens during the procedure?   To reduce your risk of infection: ? Your health care team will wash or sanitize their hands. ? Your skin will be washed with soap. ? Hair may be removed from the surgical area.  An IV tube may be inserted into one of your veins.  You will be given one or more of the following: ? A medicine to help you relax (sedative). ? A medicine to make you fall asleep (general anesthetic).  A breathing tube will be placed in your mouth.  Your surgeon will make several small cuts (incisions) in your abdomen.  The laparoscope will be inserted through one of the small incisions. The camera on the laparoscope will send images to a TV screen (monitor) in the operating room. This lets your surgeon see inside your abdomen.  Air-like gas will be pumped into your abdomen. This will expand your abdomen to give the surgeon more room to perform the surgery.  Other tools that are needed for the procedure will be inserted through the other incisions. The gallbladder will be removed through one of the incisions.  Your common bile duct may be examined. If stones are found   in the common bile duct, they may be removed.  After your gallbladder has been removed, the incisions will be closed with stitches (sutures), staples, or skin glue.  Your incisions may be covered with a bandage (dressing). The procedure may vary among health care providers and hospitals. What happens  after the procedure?  Your blood pressure, heart rate, breathing rate, and blood oxygen level will be monitored until the medicines you were given have worn off.  You will be given medicines as needed to control your pain.  Do not drive for 24 hours if you were given a sedative. This information is not intended to replace advice given to you by your health care provider. Make sure you discuss any questions you have with your health care provider. Document Released: 01/24/2005 Document Revised: 01/06/2017 Document Reviewed: 07/13/2015 Elsevier Patient Education  2020 [employer].

## 2018-12-04 NOTE — Progress Notes (Signed)
[facility] History and Physical  Reason for Referral: Gallstones, abnormal LFTs Referring Physician:  Dr. Laural Golden   Chief Complaint    Abdominal Pain      Brianna Bridges is a 23 y.o. female.  HPI: Brianna Bridges is a very pleasant 23 yo with a history of some chronic abdominal pain, and loose stools with some nausea that has plaqued her for a while now. She is thought to have some component of IBS per report, and recently she was found to have elevated LFTs that is thought to be secondary to spironolactone for acne. She stopped this medication and the AST/ ALT elevation improved. She reports experiencing some RUQ pain with meals and after meals and with greasy food. She reports some overall feeling of nausea and belching.  She reports nothing that improves the symptoms.  Her more chronic pain is in the lower abdomen.  The pain is mostly cramping in nature.    She underwent an [place] that demonstrated gallstones without signs of cholecystitis.   Past Medical History:  Diagnosis Date  . GERD (gastroesophageal reflux disease)   . Polycystic ovary syndrome     Past Surgical History:  Procedure Laterality Date  . EYE SURGERY    . Foot surgery     Plantars wart  . WISDOM TOOTH EXTRACTION      Family History  Problem Relation Age of Onset  . GER disease Mother   . Hyperlipidemia Father   . High Cholesterol Father   . Healthy Sister     Social History   Tobacco Use  . Smoking status: Never Smoker  . Smokeless tobacco: Never Used  Substance Use Topics  . Alcohol use: No  . Drug use: No    Medications: I have reviewed the patient's current medications. Allergies as of 12/04/2018      Reactions   Oxycodone Itching      Medication List       Accurate as of December 04, 2018  9:52 AM. If you have any questions, ask your nurse or doctor.        dicyclomine 10 MG capsule Commonly known as: BENTYL TAKE 1 CAPSULE (10 MG TOTAL) BY MOUTH 3 (THREE) TIMES DAILY  BEFORE MEALS.   drospirenone-ethinyl estradiol 3-0.03 MG tablet Commonly known as: YASMIN Take 1 tablet by mouth daily.   tretinoin 0.025 % cream Commonly known as: RETIN-A APPLY TOPICALLY IN THE EVENING TO FACE ONCE A DAY EXTERNALLY 30 DAYS        ROS:  A comprehensive review of systems was negative except for: Gastrointestinal: positive for abdominal pain, nausea and reflux symptoms  Blood pressure 136/84, pulse 97, temperature 98.3 F (36.8 C), temperature source Oral, resp. rate 16, height 5' 7.5" (1.715 m), weight 142 lb (64.4 kg), SpO2 96 %. Physical Exam Vitals signs reviewed.  Constitutional:      Appearance: She is well-developed.  HENT:     Head: Normocephalic and atraumatic.  Eyes:     Extraocular Movements: Extraocular movements intact.  Cardiovascular:     Rate and Rhythm: Normal rate and regular rhythm.  Pulmonary:     Effort: Pulmonary effort is normal.     Breath sounds: Normal breath sounds.  Abdominal:     General: There is no distension.     Palpations: Abdomen is soft. There is no mass.     Tenderness: There is no abdominal tenderness.     Hernia: No hernia is present.  Musculoskeletal:  Comments: Bilateral lower extremities without edema, moves all extremities  Skin:    General: Skin is warm and dry.  Neurological:     General: No focal deficit present.     Mental Status: She is alert and oriented to person, place, and time.  Psychiatric:        Mood and Affect: Mood normal.        Behavior: Behavior normal.     Results: Personally reviewed [place]- Layering stones noted, some larger stones  RUQ [place] 11/2018 IMPRESSION: Cholelithiasis and sludge in gallbladder. No gallbladder wall thickening or pericholecystic fluid.  Study otherwise unremarkable. CBD 1 mm per documentation on the [place]   LFTS-  Reported elevated AST/ ALT (100/182) on OSH labs  Results for Brianna Bridges (MRN [id number]) as of 12/04/2018 09:52  Ref. Range 11/12/2018 08:20   Alkaline Phosphatase Latest Ref Range: 39 - 117 IU/L 39  Albumin Latest Ref Range: 3.9 - 5.0 g/dL 4.0  AST Latest Ref Range: 0 - 40 IU/L 26  ALT Latest Ref Range: 0 - 32 IU/L 57 (H)  Total Protein Latest Ref Range: 6.0 - 8.5 g/dL 6.5  Total Bilirubin Latest Ref Range: 0.0 - 1.2 mg/dL 0.4  BILIRUBIN, DIRECT Latest Ref Range: 0.00 - 0.40 mg/dL 0.12   Assessment & Plan:  Brianna Bridges is a 23 y.o. female with symptomatic cholelithiasis and abnormal LFTs. She is having some symptoms that correspond with gallbladder disease, and her LFTs have improved but there is some concern that she could have some chronic issues per Dr. Laural Golden.  Discussed that not every symptom will be made better, and that she still may have her chronic issues.   -PLAN: I counseled the patient about the indication, risks and benefits of laparoscopic cholecystectomy.  She understands there is a very small chance for bleeding, infection, injury to normal structures (including common bile duct), conversion to open surgery, persistent symptoms, evolution of postcholecystectomy diarrhea, need for secondary interventions, anesthesia reaction, cardiopulmonary issues and other risks not specifically detailed here. I described the expected recovery, the plan for follow-up and the restrictions during the recovery phase.  All questions were answered.   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   All questions were answered to the satisfaction of the patient and family.   [name] 12/04/2018, 9:52 AM

## 2018-12-05 ENCOUNTER — Other Ambulatory Visit (INDEPENDENT_AMBULATORY_CARE_PROVIDER_SITE_OTHER): Payer: Self-pay | Admitting: *Deleted

## 2018-12-05 ENCOUNTER — Other Ambulatory Visit (INDEPENDENT_AMBULATORY_CARE_PROVIDER_SITE_OTHER): Payer: Self-pay | Admitting: Internal Medicine

## 2018-12-05 DIAGNOSIS — R7989 Other specified abnormal findings of blood chemistry: Secondary | ICD-10-CM

## 2018-12-05 DIAGNOSIS — R945 Abnormal results of liver function studies: Secondary | ICD-10-CM

## 2018-12-10 NOTE — Telephone Encounter (Signed)
Patient's mother was called.  Message left on her answering service. Hepatitis A total antibody positive.  Which means she has been exposed to hep A and is immune. No indication for for any further testing or checking IgM antibody. Patient will have liver biopsy at the time of cholecystectomy.

## 2018-12-10 NOTE — Telephone Encounter (Signed)
Dr. Laural Golden, this msg was sent to my [place] and I did not previously see until now.  This is a patient you previously saw so I am forwarding this msg to you.

## 2018-12-21 ENCOUNTER — Other Ambulatory Visit: Payer: 59

## 2018-12-21 ENCOUNTER — Other Ambulatory Visit: Payer: Self-pay

## 2018-12-24 ENCOUNTER — Other Ambulatory Visit: Payer: 59

## 2018-12-24 ENCOUNTER — Telehealth (INDEPENDENT_AMBULATORY_CARE_PROVIDER_SITE_OTHER): Payer: Self-pay | Admitting: Internal Medicine

## 2018-12-24 NOTE — Telephone Encounter (Signed)
A lab for [facility] was sent tot [place] [facility]. Tish at that lab was called and confirms that they have the order.

## 2018-12-24 NOTE — Telephone Encounter (Signed)
Patient left message stating her lab work was for [facility] and it should be [facility] to be done at [facility] - please advise

## 2018-12-25 LAB — HEPATITIS A ANTIBODY, TOTAL: hep A Total Ab: POSITIVE — AB

## 2018-12-25 LAB — HEPATITIS B SURFACE ANTIGEN: Hepatitis B Surface Ag: NEGATIVE

## 2018-12-26 LAB — CBC WITH DIFFERENTIAL/PLATELET
Basophils Absolute: 0 10*3/uL (ref 0.0–0.2)
Basos: 1 %
EOS (ABSOLUTE): 0 10*3/uL (ref 0.0–0.4)
Eos: 1 %
Hematocrit: 41.9 % (ref 34.0–46.6)
Hemoglobin: 14.3 g/dL (ref 11.1–15.9)
Immature Grans (Abs): 0 10*3/uL (ref 0.0–0.1)
Immature Granulocytes: 0 %
Lymphocytes Absolute: 1.8 10*3/uL (ref 0.7–3.1)
Lymphs: 49 %
MCH: 29 pg (ref 26.6–33.0)
MCHC: 34.1 g/dL (ref 31.5–35.7)
MCV: 85 fL (ref 79–97)
Monocytes Absolute: 0.3 10*3/uL (ref 0.1–0.9)
Monocytes: 9 %
Neutrophils Absolute: 1.5 10*3/uL (ref 1.4–7.0)
Neutrophils: 40 %
Platelets: 259 10*3/uL (ref 150–450)
RBC: 4.93 x10E6/uL (ref 3.77–5.28)
RDW: 12.7 % (ref 11.7–15.4)
WBC: 3.8 10*3/uL (ref 3.4–10.8)

## 2018-12-26 LAB — HEPATIC FUNCTION PANEL
ALT: 25 IU/L (ref 0–32)
AST: 21 IU/L (ref 0–40)
Albumin: 4.3 g/dL (ref 3.9–5.0)
Alkaline Phosphatase: 36 IU/L — ABNORMAL LOW (ref 39–117)
Bilirubin Total: 0.5 mg/dL (ref 0.0–1.2)
Bilirubin, Direct: 0.14 mg/dL (ref 0.00–0.40)
Total Protein: 6.8 g/dL (ref 6.0–8.5)

## 2018-12-26 LAB — HEPATITIS C ANTIBODY: Hep C Virus Ab: <0.1 s/co ratio (ref 0.0–0.9)

## 2018-12-26 LAB — SEDIMENTATION RATE: Sed Rate: 5 mm/hr (ref 0–32)

## 2018-12-26 LAB — HEPATITIS B SURFACE ANTIBODY,QUALITATIVE: Hep B Surface Ab, Qual: REACTIVE

## 2019-01-08 ENCOUNTER — Other Ambulatory Visit: Payer: Self-pay | Admitting: *Deleted

## 2019-01-08 DIAGNOSIS — Z20822 Contact with and (suspected) exposure to covid-19: Secondary | ICD-10-CM

## 2019-01-10 ENCOUNTER — Telehealth: Payer: Self-pay | Admitting: *Deleted

## 2019-01-10 LAB — NOVEL CORONAVIRUS, NAA: SARS-CoV-2, NAA: NOT DETECTED

## 2019-01-10 NOTE — Telephone Encounter (Signed)
Calling for Covid 19 results. None available as of now. Referred patient to view results on MyChart later today.

## 2019-01-11 NOTE — Patient Instructions (Signed)
Brianna Bridges  01/11/2019     @PREFPERIOPPHARMACY @   Your procedure is scheduled on  01/16/2019  Report to [facility] at   [email or web address].  Call this number if you have problems the morning of surgery:  [phone]   Remember:  Do not eat or drink after midnight.                       Take these medicines the morning of surgery with A SIP OF WATER None    Do not wear jewelry, make-up or nail polish.  Do not wear lotions, powders, or perfumes. Please wear deodorant and brush your teeth.  Do not shave 48 hours prior to surgery.  Men may shave face and neck.  Do not bring valuables to the hospital.  [facility] is not responsible for any belongings or valuables.  Contacts, dentures or bridgework may not be worn into surgery.  Leave your suitcase in the car.  After surgery it may be brought to your room.  For patients admitted to the hospital, discharge time will be determined by your treatment team.  Patients discharged the day of surgery will not be allowed to drive home.   Name and phone number of your driver:   family Special instructions:  None  Please read over the following fact sheets that you were given. Anesthesia Post-op Instructions and Care and Recovery After Surgery       Liver Biopsy, Care After These instructions give you information on caring for yourself after your procedure. Your doctor may also give you more specific instructions. Call your doctor if you have any problems or questions after your procedure. What can I expect after the procedure? After the procedure, it is common to have:  Pain and soreness where the biopsy was done.  Bruising around the area where the biopsy was done.  Sleepiness and be tired for a few days. Follow these instructions at home: Medicines  Take over-the-counter and prescription medicines only as told by your doctor.  If you were prescribed an antibiotic medicine, take it as told by your doctor. Do not  stop taking the antibiotic even if you start to feel better.  Do not take medicines such as aspirin and ibuprofen. These medicines can thin your blood. Do not take these medicines unless your doctor tells you to take them.  If you are taking prescription pain medicine, take actions to prevent or treat constipation. Your doctor may recommend that you: ? Drink enough fluid to keep your pee (urine) clear or pale yellow. ? Take over-the-counter or prescription medicines. ? Eat foods that are high in fiber, such as fresh fruits and vegetables, whole grains, and beans. ? Limit foods that are high in fat and processed sugars, such as fried and sweet foods. Caring for your cut  Follow instructions from your doctor about how to take care of your cuts from surgery (incisions). Make sure you: ? Wash your hands with soap and water before you change your bandage (dressing). If you cannot use soap and water, use hand sanitizer. ? Change your bandage as told by your doctor. ? Leave stitches (sutures), skin glue, or skin tape (adhesive) strips in place. They may need to stay in place for 2 weeks or longer. If tape strips get loose and curl up, you may trim the loose edges. Do not remove tape strips completely unless your doctor says  it is okay.  Check your cuts every day for signs of infection. Check for: ? Redness, swelling, or more pain. ? Fluid or blood. ? Pus or a bad smell. ? Warmth.  Do not take baths, swim, or use a hot tub until your doctor says it is okay to do so. Activity   Rest at home for 1-2 days or as told by your doctor. ? Avoid sitting for a long time without moving. Get up to take short walks every 1-2 hours.  Return to your normal activities as told by your doctor. Ask what activities are safe for you.  Do not do these things in the first 24 hours: ? Drive. ? Use machinery. ? Take a bath or shower.  Do not lift more than 10 pounds (4.5 kg) or play contact sports for the first 2  weeks. General instructions   Do not drink alcohol in the first week after the procedure.  Have someone stay with you for at least 24 hours after the procedure.  Get your test results. Ask your doctor or the department that is doing the test: ? When will my results be ready? ? How will I get my results? ? What are my treatment options? ? What other tests do I need? ? What are my next steps?  Keep all follow-up visits as told by your doctor. This is important. Contact a doctor if:  A cut bleeds and leaves more than just a small spot of blood.  A cut is red, puffs up (swells), or hurts more than before.  Fluid or something else comes from a cut.  A cut smells bad.  You have a fever or chills. Get help right away if:  You have swelling, bloating, or pain in your belly (abdomen).  You get dizzy or faint.  You have a rash.  You feel sick to your stomach (nauseous) or throw up (vomit).  You have trouble breathing, feel short of breath, or feel faint.  Your chest hurts.  You have problems talking or seeing.  You have trouble with your balance or moving your arms or legs. Summary  After the procedure, it is common to have pain, soreness, bruising, and tiredness.  Your doctor will tell you how to take care of yourself at home. Change your bandage, take your medicines, and limit your activities as told by your doctor.  Call your doctor if you have symptoms of infection. Get help right away if your belly swells, your cut bleeds a lot, or you have trouble talking or breathing. This information is not intended to replace advice given to you by your health care provider. Make sure you discuss any questions you have with your health care provider. Document Released: 11/03/2007 Document Revised: 02/03/2017 Document Reviewed: 02/03/2017 Elsevier Patient Education  2020 [employer].  Laparoscopic Cholecystectomy, Care After This sheet gives you information about how to care  for yourself after your procedure. Your health care provider may also give you more specific instructions. If you have problems or questions, contact your health care provider. What can I expect after the procedure? After the procedure, it is common to have:  Pain at your incision sites. You will be given medicines to control this pain.  Mild nausea or vomiting.  Bloating and possible shoulder pain from the air-like gas that was used during the procedure. Follow these instructions at home: Incision care   Follow instructions from your health care provider about how to take care of your  incisions. Make sure you: ? Wash your hands with soap and water before you change your bandage (dressing). If soap and water are not available, use hand sanitizer. ? Change your dressing as told by your health care provider. ? Leave stitches (sutures), skin glue, or adhesive strips in place. These skin closures may need to be in place for 2 weeks or longer. If adhesive strip edges start to loosen and curl up, you may trim the loose edges. Do not remove adhesive strips completely unless your health care provider tells you to do that.  Do not take baths, swim, or use a hot tub until your health care provider approves. Ask your health care provider if you can take showers. You may only be allowed to take sponge baths for bathing.  Check your incision area every day for signs of infection. Check for: ? More redness, swelling, or pain. ? More fluid or blood. ? Warmth. ? Pus or a bad smell. Activity  Do not drive or use heavy machinery while taking prescription pain medicine.  Do not lift anything that is heavier than 10 lb (4.5 kg) until your health care provider approves.  Do not play contact sports until your health care provider approves.  Do not drive for 24 hours if you were given a medicine to help you relax (sedative).  Rest as needed. Do not return to work or school until your health care provider  approves. General instructions  Take over-the-counter and prescription medicines only as told by your health care provider.  To prevent or treat constipation while you are taking prescription pain medicine, your health care provider may recommend that you: ? Drink enough fluid to keep your urine clear or pale yellow. ? Take over-the-counter or prescription medicines. ? Eat foods that are high in fiber, such as fresh fruits and vegetables, whole grains, and beans. ? Limit foods that are high in fat and processed sugars, such as fried and sweet foods. Contact a health care provider if:  You develop a rash.  You have more redness, swelling, or pain around your incisions.  You have more fluid or blood coming from your incisions.  Your incisions feel warm to the touch.  You have pus or a bad smell coming from your incisions.  You have a fever.  One or more of your incisions breaks open. Get help right away if:  You have trouble breathing.  You have chest pain.  You have increasing pain in your shoulders.  You faint or feel dizzy when you stand.  You have severe pain in your abdomen.  You have nausea or vomiting that lasts for more than one day.  You have leg pain. This information is not intended to replace advice given to you by your health care provider. Make sure you discuss any questions you have with your health care provider. Document Released: 01/24/2005 Document Revised: 01/06/2017 Document Reviewed: 07/13/2015 Elsevier Patient Education  2020 [place] Anesthesia, Adult, Care After This sheet gives you information about how to care for yourself after your procedure. Your health care provider may also give you more specific instructions. If you have problems or questions, contact your health care provider. What can I expect after the procedure? After the procedure, the following side effects are common:  Pain or discomfort at the IV site.  Nausea.   Vomiting.  Sore throat.  Trouble concentrating.  Feeling cold or chills.  Weak or tired.  Sleepiness and fatigue.  Soreness and  body aches. These side effects can affect parts of the body that were not involved in surgery. Follow these instructions at home:  For at least 24 hours after the procedure:  Have a responsible adult stay with you. It is important to have someone help care for you until you are awake and alert.  Rest as needed.  Do not: ? Participate in activities in which you could fall or become injured. ? Drive. ? Use heavy machinery. ? Drink alcohol. ? Take sleeping pills or medicines that cause drowsiness. ? Make important decisions or sign legal documents. ? Take care of children on your own. Eating and drinking  Follow any instructions from your health care provider about eating or drinking restrictions.  When you feel hungry, start by eating small amounts of foods that are soft and easy to digest (bland), such as toast. Gradually return to your regular diet.  Drink enough fluid to keep your urine pale yellow.  If you vomit, rehydrate by drinking water, juice, or clear broth. General instructions  If you have sleep apnea, surgery and certain medicines can increase your risk for breathing problems. Follow instructions from your health care provider about wearing your sleep device: ? Anytime you are sleeping, including during daytime naps. ? While taking prescription pain medicines, sleeping medicines, or medicines that make you drowsy.  Return to your normal activities as told by your health care provider. Ask your health care provider what activities are safe for you.  Take over-the-counter and prescription medicines only as told by your health care provider.  If you smoke, do not smoke without supervision.  Keep all follow-up visits as told by your health care provider. This is important. Contact a health care provider if:  You have nausea or  vomiting that does not get better with medicine.  You cannot eat or drink without vomiting.  You have pain that does not get better with medicine.  You are unable to pass urine.  You develop a skin rash.  You have a fever.  You have redness around your IV site that gets worse. Get help right away if:  You have difficulty breathing.  You have chest pain.  You have blood in your urine or stool, or you vomit blood. Summary  After the procedure, it is common to have a sore throat or nausea. It is also common to feel tired.  Have a responsible adult stay with you for the first 24 hours after general anesthesia. It is important to have someone help care for you until you are awake and alert.  When you feel hungry, start by eating small amounts of foods that are soft and easy to digest (bland), such as toast. Gradually return to your regular diet.  Drink enough fluid to keep your urine pale yellow.  Return to your normal activities as told by your health care provider. Ask your health care provider what activities are safe for you. This information is not intended to replace advice given to you by your health care provider. Make sure you discuss any questions you have with your health care provider. Document Released: 05/02/2000 Document Revised: 01/27/2017 Document Reviewed: 09/09/2016 Elsevier Patient Education  2020 [employer]. How to Use Chlorhexidine for Bathing Chlorhexidine gluconate (CHG) is a germ-killing (antiseptic) solution that is used to clean the skin. It can get rid of the bacteria that normally live on the skin and can keep them away for about 24 hours. To clean your skin with CHG, you  may be given:  A CHG solution to use in the shower or as part of a sponge bath.  A prepackaged cloth that contains CHG. Cleaning your skin with CHG may help lower the risk for infection:  While you are staying in the intensive care unit of the hospital.  If you have a vascular  access, such as a central line, to provide short-term or long-term access to your veins.  If you have a catheter to drain urine from your bladder.  If you are on a ventilator. A ventilator is a machine that helps you breathe by moving air in and out of your lungs.  After surgery. What are the risks? Risks of using CHG include:  A skin reaction.  Hearing loss, if CHG gets in your ears.  Eye injury, if CHG gets in your eyes and is not rinsed out.  The CHG product catching fire. Make sure that you avoid smoking and flames after applying CHG to your skin. Do not use CHG:  If you have a chlorhexidine allergy or have previously reacted to chlorhexidine.  On babies younger than 72 months of age. How to use CHG solution  Use CHG only as told by your health care provider, and follow the instructions on the label.  Use the full amount of CHG as directed. Usually, this is one bottle. During a shower Follow these steps when using CHG solution during a shower (unless your health care provider gives you different instructions): 1. Start the shower. 2. Use your normal soap and shampoo to wash your face and hair. 3. Turn off the shower or move out of the shower stream. 4. Pour the CHG onto a clean washcloth. Do not use any type of brush or rough-edged sponge. 5. Starting at your neck, lather your body down to your toes. Make sure you follow these instructions: ? If you will be having surgery, pay special attention to the part of your body where you will be having surgery. Scrub this area for at least 1 minute. ? Do not use CHG on your head or face. If the solution gets into your ears or eyes, rinse them well with water. ? Avoid your genital area. ? Avoid any areas of skin that have broken skin, cuts, or scrapes. ? Scrub your back and under your arms. Make sure to wash skin folds. 6. Let the lather sit on your skin for 1-2 minutes or as long as told by your health care provider. 7. Thoroughly  rinse your entire body in the shower. Make sure that all body creases and crevices are rinsed well. 8. Dry off with a clean towel. Do not put any substances on your body afterward-such as powder, lotion, or perfume-unless you are told to do so by your health care provider. Only use lotions that are recommended by the manufacturer. 9. Put on clean clothes or pajamas. 10. If it is the night before your surgery, sleep in clean sheets.  During a sponge bath Follow these steps when using CHG solution during a sponge bath (unless your health care provider gives you different instructions): 1. Use your normal soap and shampoo to wash your face and hair. 2. Pour the CHG onto a clean washcloth. 3. Starting at your neck, lather your body down to your toes. Make sure you follow these instructions: ? If you will be having surgery, pay special attention to the part of your body where you will be having surgery. Scrub this area for at least  1 minute. ? Do not use CHG on your head or face. If the solution gets into your ears or eyes, rinse them well with water. ? Avoid your genital area. ? Avoid any areas of skin that have broken skin, cuts, or scrapes. ? Scrub your back and under your arms. Make sure to wash skin folds. 4. Let the lather sit on your skin for 1-2 minutes or as long as told by your health care provider. 5. Using a different clean, wet washcloth, thoroughly rinse your entire body. Make sure that all body creases and crevices are rinsed well. 6. Dry off with a clean towel. Do not put any substances on your body afterward-such as powder, lotion, or perfume-unless you are told to do so by your health care provider. Only use lotions that are recommended by the manufacturer. 7. Put on clean clothes or pajamas. 8. If it is the night before your surgery, sleep in clean sheets. How to use CHG prepackaged cloths  Only use CHG cloths as told by your health care provider, and follow the instructions on  the label.  Use the CHG cloth on clean, dry skin.  Do not use the CHG cloth on your head or face unless your health care provider tells you to.  When washing with the CHG cloth: ? Avoid your genital area. ? Avoid any areas of skin that have broken skin, cuts, or scrapes. Before surgery Follow these steps when using a CHG cloth to clean before surgery (unless your health care provider gives you different instructions): 1. Using the CHG cloth, vigorously scrub the part of your body where you will be having surgery. Scrub using a back-and-forth motion for 3 minutes. The area on your body should be completely wet with CHG when you are done scrubbing. 2. Do not rinse. Discard the cloth and let the area air-dry. Do not put any substances on the area afterward, such as powder, lotion, or perfume. 3. Put on clean clothes or pajamas. 4. If it is the night before your surgery, sleep in clean sheets.  For general bathing Follow these steps when using CHG cloths for general bathing (unless your health care provider gives you different instructions). 1. Use a separate CHG cloth for each area of your body. Make sure you wash between any folds of skin and between your fingers and toes. Wash your body in the following order, switching to a new cloth after each step: ? The front of your neck, shoulders, and chest. ? Both of your arms, under your arms, and your hands. ? Your stomach and groin area, avoiding the genitals. ? Your right leg and foot. ? Your left leg and foot. ? The back of your neck, your back, and your buttocks. 2. Do not rinse. Discard the cloth and let the area air-dry. Do not put any substances on your body afterward-such as powder, lotion, or perfume-unless you are told to do so by your health care provider. Only use lotions that are recommended by the manufacturer. 3. Put on clean clothes or pajamas. Contact a health care provider if:  Your skin gets irritated after scrubbing.  You  have questions about using your solution or cloth. Get help right away if:  Your eyes become very red or swollen.  Your eyes itch badly.  Your skin itches badly and is red or swollen.  Your hearing changes.  You have trouble seeing.  You have swelling or tingling in your mouth or throat.  You have  trouble breathing.  You swallow any chlorhexidine. Summary  Chlorhexidine gluconate (CHG) is a germ-killing (antiseptic) solution that is used to clean the skin. Cleaning your skin with CHG may help to lower your risk for infection.  You may be given CHG to use for bathing. It may be in a bottle or in a prepackaged cloth to use on your skin. Carefully follow your health care provider's instructions and the instructions on the product label.  Do not use CHG if you have a chlorhexidine allergy.  Contact your health care provider if your skin gets irritated after scrubbing. This information is not intended to replace advice given to you by your health care provider. Make sure you discuss any questions you have with your health care provider. Document Released: 10/19/2011 Document Revised: 04/12/2018 Document Reviewed: 12/22/2016 Elsevier Patient Education  2020 [employer].

## 2019-01-14 ENCOUNTER — Encounter (HOSPITAL_COMMUNITY): Payer: Self-pay

## 2019-01-14 ENCOUNTER — Encounter (HOSPITAL_COMMUNITY)
Admission: RE | Admit: 2019-01-14 | Discharge: 2019-01-14 | Disposition: A | Discharge status: Home / Self Care | Payer: 59 | Source: Ambulatory Visit | Attending: General Surgery | Admitting: General Surgery

## 2019-01-14 ENCOUNTER — Other Ambulatory Visit (HOSPITAL_COMMUNITY)
Admission: RE | Admit: 2019-01-14 | Discharge: 2019-01-14 | Disposition: A | Discharge status: Home / Self Care | Payer: 59 | Source: Ambulatory Visit | Attending: General Surgery | Admitting: General Surgery

## 2019-01-14 ENCOUNTER — Other Ambulatory Visit: Payer: Self-pay

## 2019-01-14 DIAGNOSIS — Z01812 Encounter for preprocedural laboratory examination: Secondary | ICD-10-CM | POA: Diagnosis not present

## 2019-01-14 HISTORY — DX: Hepatitis a without hepatic coma: B15.9

## 2019-01-14 HISTORY — DX: Irritable bowel syndrome, unspecified: K58.9

## 2019-01-14 LAB — COMPREHENSIVE METABOLIC PANEL
ALT: 20 U/L (ref 0–44)
AST: 19 U/L (ref 15–41)
Albumin: 3.7 g/dL (ref 3.5–5.0)
Alkaline Phosphatase: 24 U/L — ABNORMAL LOW (ref 38–126)
Anion gap: 11 (ref 5–15)
BUN: 11 mg/dL (ref 6–20)
CO2: 23 mmol/L (ref 22–32)
Calcium: 9.1 mg/dL (ref 8.9–10.3)
Chloride: 103 mmol/L (ref 98–111)
Creatinine, Ser: 0.75 mg/dL (ref 0.44–1.00)
GFR calc Af Amer: >60 mL/min (ref >60)
GFR calc non Af Amer: >60 mL/min (ref >60)
Glucose, Bld: 101 mg/dL — ABNORMAL HIGH (ref 70–99)
Potassium: 3.7 mmol/L (ref 3.5–5.1)
Sodium: 137 mmol/L (ref 135–145)
Total Bilirubin: 0.7 mg/dL (ref 0.3–1.2)
Total Protein: 6.6 g/dL (ref 6.5–8.1)

## 2019-01-14 LAB — CBC WITH DIFFERENTIAL/PLATELET
Abs Immature Granulocytes: 0.01 10*3/uL (ref 0.00–0.07)
Basophils Absolute: 0 10*3/uL (ref 0.0–0.1)
Basophils Relative: 1 %
Eosinophils Absolute: 0.1 10*3/uL (ref 0.0–0.5)
Eosinophils Relative: 1 %
HCT: 40.8 % (ref 36.0–46.0)
Hemoglobin: 13.4 g/dL (ref 12.0–15.0)
Immature Granulocytes: 0 %
Lymphocytes Relative: 40 %
Lymphs Abs: 1.9 10*3/uL (ref 0.7–4.0)
MCH: 28.8 pg (ref 26.0–34.0)
MCHC: 32.8 g/dL (ref 30.0–36.0)
MCV: 87.6 fL (ref 80.0–100.0)
Monocytes Absolute: 0.4 10*3/uL (ref 0.1–1.0)
Monocytes Relative: 8 %
Neutro Abs: 2.4 10*3/uL (ref 1.7–7.7)
Neutrophils Relative %: 50 %
Platelets: 232 10*3/uL (ref 150–400)
RBC: 4.66 MIL/uL (ref 3.87–5.11)
RDW: 12.6 % (ref 11.5–15.5)
WBC: 4.7 10*3/uL (ref 4.0–10.5)
nRBC: 0 % (ref 0.0–0.2)

## 2019-01-14 LAB — HCG, SERUM, QUALITATIVE: Preg, Serum: NEGATIVE

## 2019-01-14 LAB — SARS CORONAVIRUS 2 (TAT 6-24 HRS): SARS Coronavirus 2: NEGATIVE

## 2019-01-16 ENCOUNTER — Ambulatory Visit (HOSPITAL_COMMUNITY): Payer: 59 | Admitting: Anesthesiology

## 2019-01-16 ENCOUNTER — Ambulatory Visit (HOSPITAL_COMMUNITY)
Admission: RE | Admit: 2019-01-16 | Discharge: 2019-01-16 | Disposition: A | Discharge status: Home / Self Care | Payer: 59 | Attending: General Surgery | Admitting: General Surgery

## 2019-01-16 ENCOUNTER — Encounter (HOSPITAL_COMMUNITY): Payer: Self-pay | Admitting: *Deleted

## 2019-01-16 ENCOUNTER — Encounter (HOSPITAL_COMMUNITY)
Admission: RE | Disposition: A | Discharge status: Home / Self Care | Payer: Self-pay | Source: Home / Self Care | Attending: General Surgery

## 2019-01-16 DIAGNOSIS — K8010 Calculus of gallbladder with chronic cholecystitis without obstruction: Secondary | ICD-10-CM | POA: Insufficient documentation

## 2019-01-16 DIAGNOSIS — Z8619 Personal history of other infectious and parasitic diseases: Secondary | ICD-10-CM | POA: Insufficient documentation

## 2019-01-16 DIAGNOSIS — Z885 Allergy status to narcotic agent status: Secondary | ICD-10-CM | POA: Diagnosis not present

## 2019-01-16 DIAGNOSIS — E282 Polycystic ovarian syndrome: Secondary | ICD-10-CM | POA: Insufficient documentation

## 2019-01-16 DIAGNOSIS — Z8379 Family history of other diseases of the digestive system: Secondary | ICD-10-CM | POA: Insufficient documentation

## 2019-01-16 DIAGNOSIS — K219 Gastro-esophageal reflux disease without esophagitis: Secondary | ICD-10-CM | POA: Diagnosis not present

## 2019-01-16 DIAGNOSIS — K8020 Calculus of gallbladder without cholecystitis without obstruction: Secondary | ICD-10-CM

## 2019-01-16 DIAGNOSIS — K7589 Other specified inflammatory liver diseases: Secondary | ICD-10-CM | POA: Insufficient documentation

## 2019-01-16 DIAGNOSIS — Z8249 Family history of ischemic heart disease and other diseases of the circulatory system: Secondary | ICD-10-CM | POA: Diagnosis not present

## 2019-01-16 DIAGNOSIS — Z79899 Other long term (current) drug therapy: Secondary | ICD-10-CM | POA: Insufficient documentation

## 2019-01-16 HISTORY — PX: LIVER BIOPSY: SHX301

## 2019-01-16 HISTORY — PX: CHOLECYSTECTOMY: SHX55

## 2019-01-16 LAB — SURGICAL PATHOLOGY

## 2019-01-16 SURGERY — LAPAROSCOPIC CHOLECYSTECTOMY
Anesthesia: General

## 2019-01-16 MED ORDER — ONDANSETRON HCL 4 MG PO TABS: 4.0000 mg | ORAL_TABLET | Freq: Every day | ORAL | 1 refills | Status: DC | PRN

## 2019-01-16 MED ORDER — LACTATED RINGERS IV SOLN
INTRAVENOUS | Status: DC | PRN
  Administered 2019-01-16: 08:00:00 via INTRAVENOUS

## 2019-01-16 MED ORDER — ARTIFICIAL TEARS OPHTHALMIC OINT
TOPICAL_OINTMENT | OPHTHALMIC | Status: AC
  Filled 2019-01-16: qty 3.5

## 2019-01-16 MED ORDER — MIDAZOLAM HCL 5 MG/5ML IJ SOLN
INTRAMUSCULAR | Status: DC | PRN
  Administered 2019-01-16: 2 mg via INTRAVENOUS

## 2019-01-16 MED ORDER — BUPIVACAINE HCL (PF) 0.5 % IJ SOLN
INTRAMUSCULAR | Status: DC | PRN
  Administered 2019-01-16: 30 mL

## 2019-01-16 MED ORDER — FENTANYL CITRATE (PF) 100 MCG/2ML IJ SOLN: 25.0000 ug | INTRAMUSCULAR | Status: DC | PRN

## 2019-01-16 MED ORDER — HYDROCODONE-ACETAMINOPHEN 5-325 MG PO TABS: 1.0000 | ORAL_TABLET | ORAL | 0 refills | Status: DC | PRN

## 2019-01-16 MED ORDER — ONDANSETRON HCL 4 MG/2ML IJ SOLN
INTRAMUSCULAR | Status: DC | PRN
  Administered 2019-01-16: 4 mg via INTRAVENOUS

## 2019-01-16 MED ORDER — SCOPOLAMINE 1 MG/3DAYS TD PT72
MEDICATED_PATCH | TRANSDERMAL | Status: AC
  Filled 2019-01-16: qty 1

## 2019-01-16 MED ORDER — ONDANSETRON HCL 4 MG/2ML IJ SOLN
INTRAMUSCULAR | Status: AC
  Filled 2019-01-16: qty 2

## 2019-01-16 MED ORDER — FENTANYL CITRATE (PF) 100 MCG/2ML IJ SOLN
INTRAMUSCULAR | Status: DC | PRN
  Administered 2019-01-16 (×4): 50 ug via INTRAVENOUS
  Administered 2019-01-16 (×2): 25 ug via INTRAVENOUS
  Administered 2019-01-16: 100 ug via INTRAVENOUS

## 2019-01-16 MED ORDER — PHENYLEPHRINE HCL (PRESSORS) 10 MG/ML IV SOLN
INTRAVENOUS | Status: DC | PRN
  Administered 2019-01-16: 80 ug via INTRAVENOUS

## 2019-01-16 MED ORDER — PROMETHAZINE HCL 25 MG/ML IJ SOLN: 6.2500 mg | INTRAMUSCULAR | Status: DC | PRN

## 2019-01-16 MED ORDER — DEXAMETHASONE SODIUM PHOSPHATE 10 MG/ML IJ SOLN
INTRAMUSCULAR | Status: AC
  Filled 2019-01-16: qty 1

## 2019-01-16 MED ORDER — SUGAMMADEX SODIUM 200 MG/2ML IV SOLN
INTRAVENOUS | Status: DC | PRN
  Administered 2019-01-16: 130.6 mg via INTRAVENOUS

## 2019-01-16 MED ORDER — DOCUSATE SODIUM 100 MG PO CAPS: 100.0000 mg | ORAL_CAPSULE | Freq: Two times a day (BID) | ORAL | 2 refills | Status: DC

## 2019-01-16 MED ORDER — MIDAZOLAM HCL 2 MG/2ML IJ SOLN
INTRAMUSCULAR | Status: AC
  Filled 2019-01-16: qty 2

## 2019-01-16 MED ORDER — DEXAMETHASONE SODIUM PHOSPHATE 4 MG/ML IJ SOLN
INTRAMUSCULAR | Status: DC | PRN
  Administered 2019-01-16: 8 mg via INTRAVENOUS

## 2019-01-16 MED ORDER — CHLORHEXIDINE GLUCONATE CLOTH 2 % EX PADS: 6.0000 | MEDICATED_PAD | Freq: Once | CUTANEOUS | Status: DC

## 2019-01-16 MED ORDER — LACTATED RINGERS IV SOLN
Freq: Once | INTRAVENOUS | Status: AC
  Administered 2019-01-16: 1000 mL via INTRAVENOUS

## 2019-01-16 MED ORDER — SODIUM CHLORIDE 0.9 % IR SOLN
Status: DC | PRN
  Administered 2019-01-16: 1000 mL

## 2019-01-16 MED ORDER — HYDROMORPHONE HCL 1 MG/ML IJ SOLN
0.2500 mg | INTRAMUSCULAR | Status: DC | PRN
  Administered 2019-01-16 (×2): 0.5 mg via INTRAVENOUS
  Filled 2019-01-16 (×2): qty 0.5

## 2019-01-16 MED ORDER — PROPOFOL 10 MG/ML IV BOLUS
INTRAVENOUS | Status: DC | PRN
  Administered 2019-01-16: 150 mg via INTRAVENOUS

## 2019-01-16 MED ORDER — SODIUM CHLORIDE 0.9 % IV SOLN
INTRAVENOUS | Status: AC
  Filled 2019-01-16: qty 2

## 2019-01-16 MED ORDER — SUCCINYLCHOLINE CHLORIDE 20 MG/ML IJ SOLN
INTRAMUSCULAR | Status: DC | PRN
  Administered 2019-01-16: 120 mg via INTRAVENOUS

## 2019-01-16 MED ORDER — ROCURONIUM BROMIDE 100 MG/10ML IV SOLN
INTRAVENOUS | Status: DC | PRN
  Administered 2019-01-16: 35 mg via INTRAVENOUS

## 2019-01-16 MED ORDER — MEPERIDINE HCL 50 MG/ML IJ SOLN: 6.2500 mg | INTRAMUSCULAR | Status: DC | PRN

## 2019-01-16 MED ORDER — FENTANYL CITRATE (PF) 250 MCG/5ML IJ SOLN
INTRAMUSCULAR | Status: AC
  Filled 2019-01-16: qty 5

## 2019-01-16 MED ORDER — LIDOCAINE HCL (CARDIAC) PF 100 MG/5ML IV SOSY
PREFILLED_SYRINGE | INTRAVENOUS | Status: DC | PRN
  Administered 2019-01-16: 40 mg via INTRAVENOUS

## 2019-01-16 MED ORDER — HEMOSTATIC AGENTS (NO CHARGE) OPTIME
TOPICAL | Status: DC | PRN
  Administered 2019-01-16: 1 via TOPICAL

## 2019-01-16 MED ORDER — SODIUM CHLORIDE 0.9 % IV SOLN
2.0000 g | INTRAVENOUS | Status: AC
  Administered 2019-01-16: 09:00:00 2 g via INTRAVENOUS

## 2019-01-16 MED ORDER — PROPOFOL 10 MG/ML IV BOLUS
INTRAVENOUS | Status: AC
  Filled 2019-01-16: qty 40

## 2019-01-16 MED ORDER — BUPIVACAINE HCL (PF) 0.5 % IJ SOLN
INTRAMUSCULAR | Status: AC
  Filled 2019-01-16: qty 30

## 2019-01-16 MED ORDER — FENTANYL CITRATE (PF) 100 MCG/2ML IJ SOLN
INTRAMUSCULAR | Status: AC
  Filled 2019-01-16: qty 2

## 2019-01-16 MED ORDER — SCOPOLAMINE 1 MG/3DAYS TD PT72
1.0000 | MEDICATED_PATCH | Freq: Once | TRANSDERMAL | Status: DC
  Administered 2019-01-16: 1.5 mg via TRANSDERMAL

## 2019-01-16 SURGICAL SUPPLY — 49 items
APPLIER CLIP ROT 10 11.4 M/L (STAPLE) ×3
BAG RETRIEVAL 10 (BASKET) ×1
BAG RETRIEVAL 10MM (BASKET) ×1
BLADE SURG 15 STRL LF DISP TIS (BLADE) ×1 IMPLANT
BLADE SURG 15 STRL SS (BLADE) ×2
CHLORAPREP W/TINT 26 (MISCELLANEOUS) ×3 IMPLANT
CLIP APPLIE ROT 10 11.4 M/L (STAPLE) ×1 IMPLANT
CLOTH BEACON ORANGE TIMEOUT ST (SAFETY) ×3 IMPLANT
COVER LIGHT HANDLE STERIS (MISCELLANEOUS) ×6 IMPLANT
COVER WAND RF STERILE (DRAPES) ×3 IMPLANT
DECANTER SPIKE VIAL GLASS SM (MISCELLANEOUS) ×3 IMPLANT
DERMABOND ADVANCED (GAUZE/BANDAGES/DRESSINGS) ×2
DERMABOND ADVANCED .7 DNX12 (GAUZE/BANDAGES/DRESSINGS) ×1 IMPLANT
ELECT REM PT RETURN 9FT ADLT (ELECTROSURGICAL) ×3
ELECTRODE REM PT RTRN 9FT ADLT (ELECTROSURGICAL) ×1 IMPLANT
GLOVE BIO SURGEON STRL SZ 6.5 (GLOVE) ×2 IMPLANT
GLOVE BIO SURGEON STRL SZ7.5 (GLOVE) ×3 IMPLANT
GLOVE BIO SURGEONS STRL SZ 6.5 (GLOVE) ×1
GLOVE BIOGEL M 6.5 STRL (GLOVE) ×3 IMPLANT
GLOVE BIOGEL PI IND STRL 6 (GLOVE) ×1 IMPLANT
GLOVE BIOGEL PI IND STRL 6.5 (GLOVE) ×1 IMPLANT
GLOVE BIOGEL PI IND STRL 7.0 (GLOVE) ×3 IMPLANT
GLOVE BIOGEL PI IND STRL 7.5 (GLOVE) ×1 IMPLANT
GLOVE BIOGEL PI INDICATOR 6 (GLOVE) ×2
GLOVE BIOGEL PI INDICATOR 6.5 (GLOVE) ×2
GLOVE BIOGEL PI INDICATOR 7.0 (GLOVE) ×6
GLOVE BIOGEL PI INDICATOR 7.5 (GLOVE) ×2
GOWN STRL REUS W/TWL LRG LVL3 (GOWN DISPOSABLE) ×9 IMPLANT
HEMOSTAT SNOW SURGICEL 2X4 (HEMOSTASIS) ×3 IMPLANT
INST SET LAPROSCOPIC AP (KITS) ×3 IMPLANT
KIT TURNOVER KIT A (KITS) ×3 IMPLANT
MANIFOLD NEPTUNE II (INSTRUMENTS) ×3 IMPLANT
NEEDLE INSUFFLATION 14GA 120MM (NEEDLE) ×3 IMPLANT
NS IRRIG 1000ML POUR BTL (IV SOLUTION) ×3 IMPLANT
PACK LAP CHOLE LZT030E (CUSTOM PROCEDURE TRAY) ×3 IMPLANT
PAD ARMBOARD 7.5X6 YLW CONV (MISCELLANEOUS) ×3 IMPLANT
SET BASIN LINEN APH (SET/KITS/TRAYS/PACK) ×3 IMPLANT
SET TUBE SMOKE EVAC HIGH FLOW (TUBING) ×3 IMPLANT
SLEEVE ENDOPATH XCEL 5M (ENDOMECHANICALS) ×3 IMPLANT
SUT MNCRL AB 4-0 PS2 18 (SUTURE) ×3 IMPLANT
SUT VICRYL 0 UR6 27IN ABS (SUTURE) ×3 IMPLANT
SYS BAG RETRIEVAL 10MM (BASKET) ×1
SYSTEM BAG RETRIEVAL 10MM (BASKET) ×1 IMPLANT
TROCAR ENDO BLADELESS 11MM (ENDOMECHANICALS) ×3 IMPLANT
TROCAR XCEL NON-BLD 5MMX100MML (ENDOMECHANICALS) ×3 IMPLANT
TROCAR XCEL UNIV SLVE 11M 100M (ENDOMECHANICALS) ×3 IMPLANT
TUBE CONNECTING 12'X1/4 (SUCTIONS) ×1
TUBE CONNECTING 12X1/4 (SUCTIONS) ×2 IMPLANT
WARMER LAPAROSCOPE (MISCELLANEOUS) ×3 IMPLANT

## 2019-01-16 NOTE — Anesthesia Postprocedure Evaluation (Signed)
Anesthesia Post Note  Patient: Brianna Bridges  Procedure(s) Performed: LAPAROSCOPIC CHOLECYSTECTOMY (N/A ) LIVER BIOPSY (N/A )  Patient location during evaluation: PACU Anesthesia Type: General Level of consciousness: awake and alert, oriented and patient cooperative Pain management: pain level controlled Vital Signs Assessment: post-procedure vital signs reviewed and stable Respiratory status: spontaneous breathing Cardiovascular status: stable Postop Assessment: no apparent nausea or vomiting Anesthetic complications: no     Last Vitals:  Vitals:   01/16/19 0930 01/16/19 0945  BP: (!) 139/94 124/78  Pulse: (!) 104 81  Resp: 16 12  Temp: 36.9 C   SpO2: 97% 99%    Last Pain:  Vitals:   01/16/19 0945  TempSrc:   PainSc: 7                  ADAMS, AMY A

## 2019-01-16 NOTE — Discharge Instructions (Signed)
Discharge Laparoscopic Surgery Instructions:  Common Complaints: Right shoulder pain is common after laparoscopic surgery. This is secondary to the gas used in the surgery being trapped under the diaphragm.  Walk to help your body absorb the gas. This will improve in a few days. Pain at the port sites are common, especially the larger port sites. This will improve with time.  Some nausea is common and poor appetite. The main goal is to stay hydrated the first few days after surgery.   Diet/ Activity: Diet as tolerated. You may not have an appetite, but it is important to stay hydrated. Drink 64 ounces of water a day. Your appetite will return with time.  Shower per your regular routine daily.  Do not take hot showers. Take warm showers that are less than 10 minutes. Rest and listen to your body, but do not remain in bed all day.  Walk everyday for at least 15-20 minutes. Deep cough and move around every 1-2 hours in the first few days after surgery.  Do not lift > 10 lbs, perform excessive bending, pushing, pulling, squatting for 1-2 weeks after surgery.  Do not pick at the dermabond glue on your incision sites.  This glue film will remain in place for 1-2 weeks and will start to peel off.  Do not place lotions or balms on your incision unless instructed to specifically by Dr. Constance Haw.   Medication: Take tylenol and ibuprofen as needed for pain control, alternating every 4-6 hours.  Example:  Take Norco for breakthrough pain every 4 hours.  Take Colace for constipation related to narcotic pain medication. If you do not have a bowel movement in 2 days, take Miralax over the counter.  Drink plenty of water to also prevent constipation.   Contact Information: If you have questions or concerns, please call our office, [phone], Monday- Thursday 8AM-5PM and Friday 8AM-12Noon.  If it is after hours or on the weekend, please call [facility], [phone], and ask to speak to the  surgeon on call for Dr. Constance Haw at [facility].    Laparoscopic Cholecystectomy, Care After This sheet gives you information about how to care for yourself after your procedure. Your health care provider may also give you more specific instructions. If you have problems or questions, contact your health care provider. What can I expect after the procedure? After the procedure, it is common to have:  Pain at your incision sites. You will be given medicines to control this pain.  Mild nausea or vomiting.  Bloating and possible shoulder pain from the air-like gas that was used during the procedure. Follow these instructions at home: Incision care   Follow instructions from your health care provider about how to take care of your incisions. Make sure you: ? Wash your hands with soap and water before you change your bandage (dressing). If soap and water are not available, use hand sanitizer. ? Change your dressing as told by your health care provider. ? Leave stitches (sutures), skin glue, or adhesive strips in place. These skin closures may need to be in place for 2 weeks or longer. If adhesive strip edges start to loosen and curl up, you may trim the loose edges. Do not remove adhesive strips completely unless your health care provider tells you to do that.  Do not take baths, swim, or use a hot tub until your health care provider approves.   You may shower.  Check your incision area every day for signs of  infection. Check for: ? More redness, swelling, or pain. ? More fluid or blood. ? Warmth. ? Pus or a bad smell. Activity  Do not drive or use heavy machinery while taking prescription pain medicine.  Do not lift anything that is heavier than 10 lb (4.5 kg) until your health care provider approves.  Do not play contact sports until your health care provider approves.  Do not drive for 24 hours if you were given a medicine to help you relax (sedative).  Rest as needed. Do not  return to work or school until your health care provider approves. General instructions  Take over-the-counter and prescription medicines only as told by your health care provider.  To prevent or treat constipation while you are taking prescription pain medicine, your health care provider may recommend that you: ? Drink enough fluid to keep your urine clear or pale yellow. ? Take over-the-counter or prescription medicines. ? Eat foods that are high in fiber, such as fresh fruits and vegetables, whole grains, and beans. ? Limit foods that are high in fat and processed sugars, such as fried and sweet foods. Contact a health care provider if:  You develop a rash.  You have more redness, swelling, or pain around your incisions.  You have more fluid or blood coming from your incisions.  Your incisions feel warm to the touch.  You have pus or a bad smell coming from your incisions.  You have a fever.  One or more of your incisions breaks open. Get help right away if:  You have trouble breathing.  You have chest pain.  You have increasing pain in your shoulders.  You faint or feel dizzy when you stand.  You have severe pain in your abdomen.  You have nausea or vomiting that lasts for more than one day.  You have leg pain. This information is not intended to replace advice given to you by your health care provider. Make sure you discuss any questions you have with your health care provider. Document Released: 01/24/2005 Document Revised: 01/06/2017 Document Reviewed: 07/13/2015 Elsevier Patient Education  2020 [employer].

## 2019-01-16 NOTE — Anesthesia Procedure Notes (Signed)
Procedure Name: Intubation Date/Time: 01/16/2019 8:30 AM Performed by: Andree Elk, Amy A, CRNA Pre-anesthesia Checklist: Patient identified, Patient being monitored, Timeout performed, Emergency Drugs available and Suction available Patient Re-evaluated:Patient Re-evaluated prior to induction Oxygen Delivery Method: Circle system utilized Preoxygenation: Pre-oxygenation with 100% oxygen Induction Type: IV induction Laryngoscope Size: Mac and 3 Grade View: Grade I Tube type: Oral Tube size: 7.0 mm Number of attempts: 1 Airway Equipment and Method: Stylet Placement Confirmation: ETT inserted through vocal cords under direct vision,  positive ETCO2 and breath sounds checked- equal and bilateral Secured at: 21 cm Tube secured with: Tape Dental Injury: Teeth and Oropharynx as per pre-operative assessment

## 2019-01-16 NOTE — Transfer of Care (Signed)
Immediate Anesthesia Transfer of Care Note  Patient: Brianna Bridges  Procedure(s) Performed: LAPAROSCOPIC CHOLECYSTECTOMY (N/A ) LIVER BIOPSY (N/A )  Patient Location: PACU  Anesthesia Type:General  Level of Consciousness: awake, oriented and patient cooperative  Airway & Oxygen Therapy: Patient Spontanous Breathing  Post-op Assessment: Report given to RN and Post -op Vital signs reviewed and stable  Post vital signs: Reviewed and stable  Last Vitals:  Vitals Value Taken Time  BP 139/94 01/16/19 0930  Temp    Pulse 90 01/16/19 0933  Resp 17 01/16/19 0933  SpO2 98 % 01/16/19 0933  Vitals shown include unvalidated device data.  Last Pain:  Vitals:   01/16/19 0736  TempSrc: Oral  PainSc: 0-No pain      Patients Stated Pain Goal: 8 [phone]  Complications: No apparent anesthesia complications

## 2019-01-16 NOTE — Progress Notes (Signed)
[facility]  Spoke with her mother regarding surgery. Patient Rx sent in. Plan for a post operative phone call unless needs to be seen in person.  Curlene Labrum, MD [facility] [address] [place], [place] [address] [phone] (office)

## 2019-01-16 NOTE — Anesthesia Preprocedure Evaluation (Signed)
Anesthesia Evaluation  Patient identified by MRN, date of birth, ID band Patient awake    Reviewed: Allergy & Precautions, NPO status , Patient's Chart, lab work & pertinent test results  Airway Mallampati: I  TM Distance: >3 FB Neck ROM: Full    Dental no notable dental hx. (+) Dental Advisory Given, Teeth Intact   Pulmonary neg pulmonary ROS,    Pulmonary exam normal breath sounds clear to auscultation       Cardiovascular Exercise Tolerance: Good Normal cardiovascular exam Rhythm:Regular Rate:Normal     Neuro/Psych negative neurological ROS  negative psych ROS   GI/Hepatic GERD  ,(+) Hepatitis -, A  Endo/Other  negative endocrine ROS  Renal/GU negative Renal ROS   Polycystic ovarian syndrome     Musculoskeletal negative musculoskeletal ROS (+)   Abdominal   Peds  Hematology negative hematology ROS (+)   Anesthesia Other Findings   Reproductive/Obstetrics negative OB ROS                            Anesthesia Physical Anesthesia Plan  ASA: I  Anesthesia Plan: General   Post-op Pain Management:    Induction: Intravenous  PONV Risk Score and Plan: 4 or greater and Ondansetron, Dexamethasone, Midazolam and Scopolamine patch - Pre-op  Airway Management Planned: Oral ETT  Additional Equipment:   Intra-op Plan:   Post-operative Plan: Extubation in OR  Informed Consent: I have reviewed the patients History and Physical, chart, labs and discussed the procedure including the risks, benefits and alternatives for the proposed anesthesia with the patient or authorized representative who has indicated his/her understanding and acceptance.     Dental advisory given  Plan Discussed with: CRNA  Anesthesia Plan Comments:        Anesthesia Quick Evaluation

## 2019-01-16 NOTE — H&P (Signed)
[facility] History and Physical  Reason for Referral: Gallstones, abnormal LFTs  Referring Physician:  Dr. Wille Glaser is a 23 y.o. female.  HPI: Brianna Bridges is a very pleasant 23 yo with a history of some chronic abdominal pain, and loose stools with some nausea that has plaqued her for a while now. She is thought to have some component of IBS per report, and recently she was found to have elevated LFTs that is thought to be secondary to spironolactone for acne. She stopped this medication and the AST/ ALT elevation improved. She reports experiencing some RUQ pain with meals and after meals and with greasy food. She reports some overall feeling of nausea and belching.  She reports nothing that improves the symptoms.  Her more chronic pain is in the lower abdomen.  The pain is mostly cramping in nature.    She underwent an [place] that demonstrated gallstones without signs of cholecystitis.   She has had no changes since seeing me last.   Past Medical History:  Diagnosis Date  . GERD (gastroesophageal reflux disease)   . Hepatitis A   . IBS (irritable bowel syndrome)   . Polycystic ovary syndrome     Past Surgical History:  Procedure Laterality Date  . EYE SURGERY Bilateral   . Foot surgery     Plantars wart  . WISDOM TOOTH EXTRACTION      Family History  Problem Relation Age of Onset  . GER disease Mother   . Hyperlipidemia Father   . High Cholesterol Father   . Healthy Sister     Social History   Tobacco Use  . Smoking status: Never Smoker  . Smokeless tobacco: Never Used  Substance Use Topics  . Alcohol use: No  . Drug use: No    Medications: I have reviewed the patient's current medications. Medications Prior to Admission  Medication Sig Dispense Refill  . dicyclomine (BENTYL) 10 MG capsule TAKE 1 CAPSULE (10 MG TOTAL) BY MOUTH 3 (THREE) TIMES DAILY BEFORE MEALS. (Patient taking differently: Take 10 mg by mouth daily. ) 90  capsule 1  . drospirenone-ethinyl estradiol (YASMIN) 3-0.03 MG tablet Take 1 tablet by mouth daily.    . hydroxypropyl methylcellulose / hypromellose (ISOPTO TEARS / GONIOVISC) 2.5 % ophthalmic solution Place 1 drop into both eyes 2 (two) times daily.    Marland Kitchen tretinoin (RETIN-A) 0.025 % cream Apply 1 application topically at bedtime.      Current Facility-Administered Medications  Medication Dose Route Frequency Provider Last Rate Last Dose  . cefoTEtan (CEFOTAN) 2 g in sodium chloride 0.9 % 100 mL IVPB  2 g Intravenous On Call to Ruma, MD      . Chlorhexidine Gluconate Cloth 2 % PADS 6 each  6 each Topical Once Brianna Cagey, MD       And  . Chlorhexidine Gluconate Cloth 2 % PADS 6 each  6 each Topical Once Brianna Cagey, MD      . scopolamine (TRANSDERM-SCOP) 1 MG/3DAYS 1.5 mg  1 patch Transdermal Once Battula, Rajamani C, MD   1.5 mg at 01/16/19 0759   Allergies  Allergen Reactions  . Oxycodone Itching     ROS:  A comprehensive review of systems was negative except for: Gastrointestinal: positive for abdominal pain, nausea and reflux symptoms  Blood pressure 119/86, temperature 98.8 F (37.1 C), temperature source Oral, resp. rate 20, last menstrual period 01/02/2019, SpO2 98 %. Physical  Exam Vitals signs reviewed.  Constitutional:      Appearance: Normal appearance.  HENT:     Head: Normocephalic and atraumatic.     Nose: Nose normal.     Mouth/Throat:     Mouth: Mucous membranes are moist.  Eyes:     Extraocular Movements: Extraocular movements intact.     Pupils: Pupils are equal, round, and reactive to light.  Neck:     Musculoskeletal: Normal range of motion.  Cardiovascular:     Rate and Rhythm: Normal rate.  Pulmonary:     Effort: Pulmonary effort is normal.  Abdominal:     General: There is no distension.     Palpations: Abdomen is soft.     Tenderness: There is no abdominal tenderness.  Musculoskeletal: Normal range of motion.         General: No swelling.  Skin:    General: Skin is warm and dry.  Neurological:     General: No focal deficit present.     Mental Status: She is alert and oriented to person, place, and time.  Psychiatric:        Mood and Affect: Mood normal.        Behavior: Behavior normal.        Thought Content: Thought content normal.        Judgment: Judgment normal.     Results: Results for orders placed or performed during the hospital encounter of 01/14/19 (from the past 48 hour(s))  hCG, serum, qualitative (Not at [facility])     Status: None   Collection Time: 01/14/19  9:33 AM  Result Value Ref Range   Preg, Serum NEGATIVE NEGATIVE    Comment:        THE SENSITIVITY OF THIS METHODOLOGY IS >10 mIU/mL. Performed at [facility], [address], [place], [place] [address]   CBC WITH DIFFERENTIAL     Status: None   Collection Time: 01/14/19  9:33 AM  Result Value Ref Range   WBC 4.7 4.0 - 10.5 K/uL   RBC 4.66 3.87 - 5.11 MIL/uL   Hemoglobin 13.4 12.0 - 15.0 g/dL   HCT 40.8 36.0 - 46.0 %   MCV 87.6 80.0 - 100.0 fL   MCH 28.8 26.0 - 34.0 pg   MCHC 32.8 30.0 - 36.0 g/dL   RDW 12.6 11.5 - 15.5 %   Platelets 232 150 - 400 K/uL   nRBC 0.0 0.0 - 0.2 %   Neutrophils Relative % 50 %   Neutro Abs 2.4 1.7 - 7.7 K/uL   Lymphocytes Relative 40 %   Lymphs Abs 1.9 0.7 - 4.0 K/uL   Monocytes Relative 8 %   Monocytes Absolute 0.4 0.1 - 1.0 K/uL   Eosinophils Relative 1 %   Eosinophils Absolute 0.1 0.0 - 0.5 K/uL   Basophils Relative 1 %   Basophils Absolute 0.0 0.0 - 0.1 K/uL   Immature Granulocytes 0 %   Abs Immature Granulocytes 0.01 0.00 - 0.07 K/uL    Comment: Performed at [facility], [address], [place],  [address]  Comprehensive metabolic panel     Status: Abnormal   Collection Time: 01/14/19  9:33 AM  Result Value Ref Range   Sodium 137 135 - 145 mmol/L   Potassium 3.7 3.5 - 5.1 mmol/L   Chloride 103 98 - 111 mmol/L   CO2 23 22 - 32 mmol/L   Glucose, Bld 101 (H) 70 - 99  mg/dL   BUN 11 6 - 20  mg/dL   Creatinine, Ser 0.75 0.44 - 1.00 mg/dL   Calcium 9.1 8.9 - 10.3 mg/dL   Total Protein 6.6 6.5 - 8.1 g/dL   Albumin 3.7 3.5 - 5.0 g/dL   AST 19 15 - 41 U/L   ALT 20 0 - 44 U/L   Alkaline Phosphatase 24 (L) 38 - 126 U/L   Total Bilirubin 0.7 0.3 - 1.2 mg/dL   GFR calc non Af Amer >60 >60 mL/min   GFR calc Af Amer >60 >60 mL/min   Anion gap 11 5 - 15    Comment: Performed at [facility], [address] [place], [place] [address]   Results: RUQ [place] 11/2018 IMPRESSION: Cholelithiasis and sludge in gallbladder. No gallbladder wall thickening or pericholecystic fluid.  Study otherwise unremarkable. CBD 1 mm per documentation on the [place]   Labs:  LFTs improved  Results for Brianna Bridges, Brianna Bridges (MRN [id number]) as of 01/16/2019 08:15  Ref. Range 01/14/2019 09:33  Sodium Latest Ref Range: 135 - 145 mmol/L 137  Potassium Latest Ref Range: 3.5 - 5.1 mmol/L 3.7  Chloride Latest Ref Range: 98 - 111 mmol/L 103  CO2 Latest Ref Range: 22 - 32 mmol/L 23  Glucose Latest Ref Range: 70 - 99 mg/dL 101 (H)  BUN Latest Ref Range: 6 - 20 mg/dL 11  Creatinine Latest Ref Range: 0.44 - 1.00 mg/dL 0.75  Calcium Latest Ref Range: 8.9 - 10.3 mg/dL 9.1  Anion gap Latest Ref Range: 5 - 15  11  Alkaline Phosphatase Latest Ref Range: 38 - 126 U/L 24 (L)  Albumin Latest Ref Range: 3.5 - 5.0 g/dL 3.7  AST Latest Ref Range: 15 - 41 U/L 19  ALT Latest Ref Range: 0 - 44 U/L 20  Total Protein Latest Ref Range: 6.5 - 8.1 g/dL 6.6  Total Bilirubin Latest Ref Range: 0.3 - 1.2 mg/dL 0.7  GFR, Est Non African American Latest Ref Range: >60 mL/min >60  GFR, Est African American Latest Ref Range: >60 mL/min >60   Assessment & Plan:  KM WEATHERBEE is a 23 y.o. female with symptomatic cholelithiasis and history of abnormal LFTs. Dr. Laural Golden requested liver biopsy to ensure no other pathology.   PLAN: I counseled the patient about the indication, risks and benefits of laparoscopic  cholecystectomy.  She understands there is a very small chance for bleeding, infection, injury to normal structures (including common bile duct), conversion to open surgery, persistent symptoms, evolution of postcholecystectomy diarrhea, need for secondary interventions, anesthesia reaction, cardiopulmonary issues and other risks not specifically detailed here. I described the expected recovery, the plan for follow-up and the restrictions during the recovery phase.  All questions were answered.   Brianna Bridges 01/16/2019, 8:13 AM

## 2019-01-16 NOTE — Op Note (Addendum)
Operative Note   Preoperative Diagnosis: Symptomatic cholelithiasis, abnormal liver function test    Postoperative Diagnosis: Same   Procedure(s) Performed: Laparoscopic cholecystectomy, wedge liver biopsy    Surgeon: Lanell Matar. Constance Haw, MD   Assistants: Aviva Signs, MD    Anesthesia: General endotracheal   Anesthesiologist: Denese Killings, MD    Specimens: Gallbladder    Estimated Blood Loss: Minimal    Blood Replacement: None    Complications: None    Operative Findings:  Distended gallbladder with stones    Procedure: The patient was taken to the operating room and placed supine. General endotracheal anesthesia was induced. Intravenous antibiotics were administered per protocol. An orogastric tube positioned to decompress the stomach. The abdomen was prepared and draped in the usual sterile fashion.    A supraumbilical incision was made and a Veress technique was utilized to achieve pneumoperitoneum to 15 mmHg with carbon dioxide. A 11 mm optiview port was placed through the supraumbilical region, and a 10 mm 0-degree operative laparoscope was introduced. The area underlying the trocar and Veress needle were inspected and without evidence of injury.  Remaining trocars were placed under direct vision. Two 5 mm ports were placed in the right abdomen, between the anterior axillary and midclavicular line.  A final 11 mm port was placed through the mid-epigastrium, near the falciform ligament.    The gallbladder fundus was elevated cephalad and the infundibulum was retracted to the patient's right. The gallbladder/cystic duct junction was skeletonized. The cystic artery noted in the triangle of Calot and was also skeletonized.  We then continued liberal medial and lateral dissection until the critical view of safety was achieved.    The cystic duct was triply clipped and the cystic artery was doubly clipped and both were divided. The gallbladder was then dissected from the liver  bed with electrocautery. A wedge liver biopsy was taken from the edge of the liver using electrocautery.  The specimen was placed in an Endopouch and was retrieved through the epigastric site.   Final inspection revealed acceptable hemostasis. Surgical Emogene Morgan was placed in the gallbladder bed. Trocars were removed and pneumoperitoneum was released.  0 Vicryl fascial sutures was used to close the umbilical port site and the epigastric site was over the rib.  Skin incisions were closed with 4-0 Monocryl subcuticular sutures and Dermabond. The patient was awakened from anesthesia and extubated without complication.    Curlene Labrum, MD [facility] [address] [place], [place] [address] [phone] (office)

## 2019-01-17 ENCOUNTER — Encounter: Payer: Self-pay | Admitting: General Surgery

## 2019-01-17 ENCOUNTER — Other Ambulatory Visit: Payer: Self-pay

## 2019-01-18 ENCOUNTER — Encounter: Payer: Self-pay | Admitting: General Surgery

## 2019-01-21 ENCOUNTER — Encounter: Payer: Self-pay | Admitting: General Surgery

## 2019-01-22 ENCOUNTER — Encounter: Payer: Self-pay | Admitting: General Surgery

## 2019-01-22 MED ORDER — CYCLOBENZAPRINE HCL 5 MG PO TABS: 5.0000 mg | ORAL_TABLET | Freq: Three times a day (TID) | ORAL | 0 refills | Status: DC | PRN

## 2019-01-23 ENCOUNTER — Other Ambulatory Visit (INDEPENDENT_AMBULATORY_CARE_PROVIDER_SITE_OTHER): Payer: Self-pay | Admitting: Internal Medicine

## 2019-01-28 ENCOUNTER — Encounter (INDEPENDENT_AMBULATORY_CARE_PROVIDER_SITE_OTHER): Payer: Self-pay

## 2019-01-29 ENCOUNTER — Other Ambulatory Visit (INDEPENDENT_AMBULATORY_CARE_PROVIDER_SITE_OTHER): Payer: Self-pay | Admitting: *Deleted

## 2019-01-29 ENCOUNTER — Other Ambulatory Visit: Payer: Self-pay

## 2019-01-29 ENCOUNTER — Encounter (INDEPENDENT_AMBULATORY_CARE_PROVIDER_SITE_OTHER): Payer: Self-pay | Admitting: Internal Medicine

## 2019-01-29 ENCOUNTER — Ambulatory Visit (HOSPITAL_COMMUNITY)
Admission: RE | Admit: 2019-01-29 | Discharge: 2019-01-29 | Disposition: A | Discharge status: Home / Self Care | Payer: 59 | Source: Ambulatory Visit | Attending: Internal Medicine | Admitting: Internal Medicine

## 2019-01-29 ENCOUNTER — Encounter (INDEPENDENT_AMBULATORY_CARE_PROVIDER_SITE_OTHER): Payer: Self-pay

## 2019-01-29 ENCOUNTER — Other Ambulatory Visit (HOSPITAL_COMMUNITY)
Admission: RE | Admit: 2019-01-29 | Discharge: 2019-01-29 | Disposition: A | Discharge status: Home / Self Care | Payer: 59 | Source: Ambulatory Visit | Attending: Internal Medicine | Admitting: Internal Medicine

## 2019-01-29 ENCOUNTER — Ambulatory Visit (INDEPENDENT_AMBULATORY_CARE_PROVIDER_SITE_OTHER): Payer: 59 | Admitting: Internal Medicine

## 2019-01-29 ENCOUNTER — Telehealth (INDEPENDENT_AMBULATORY_CARE_PROVIDER_SITE_OTHER): Payer: Self-pay | Admitting: General Surgery

## 2019-01-29 VITALS — BP 122/84 | HR 98 | Temp 97.8°F | Ht 67.5 in | Wt 139.5 lb

## 2019-01-29 DIAGNOSIS — G8929 Other chronic pain: Secondary | ICD-10-CM

## 2019-01-29 DIAGNOSIS — R1010 Upper abdominal pain, unspecified: Secondary | ICD-10-CM

## 2019-01-29 DIAGNOSIS — R109 Unspecified abdominal pain: Secondary | ICD-10-CM

## 2019-01-29 DIAGNOSIS — R1011 Right upper quadrant pain: Secondary | ICD-10-CM | POA: Diagnosis not present

## 2019-01-29 DIAGNOSIS — K8020 Calculus of gallbladder without cholecystitis without obstruction: Secondary | ICD-10-CM

## 2019-01-29 DIAGNOSIS — R1031 Right lower quadrant pain: Secondary | ICD-10-CM

## 2019-01-29 HISTORY — DX: Other chronic pain: G89.29

## 2019-01-29 LAB — COMPREHENSIVE METABOLIC PANEL
ALT: 26 U/L (ref 0–44)
AST: 19 U/L (ref 15–41)
Albumin: 4.3 g/dL (ref 3.5–5.0)
Alkaline Phosphatase: 32 U/L — ABNORMAL LOW (ref 38–126)
Anion gap: 13 (ref 5–15)
BUN: 13 mg/dL (ref 6–20)
CO2: 23 mmol/L (ref 22–32)
Calcium: 9.5 mg/dL (ref 8.9–10.3)
Chloride: 102 mmol/L (ref 98–111)
Creatinine, Ser: 0.79 mg/dL (ref 0.44–1.00)
GFR calc Af Amer: >60 mL/min (ref >60)
GFR calc non Af Amer: >60 mL/min (ref >60)
Glucose, Bld: 95 mg/dL (ref 70–99)
Potassium: 4.4 mmol/L (ref 3.5–5.1)
Sodium: 138 mmol/L (ref 135–145)
Total Bilirubin: 0.6 mg/dL (ref 0.3–1.2)
Total Protein: 7.8 g/dL (ref 6.5–8.1)

## 2019-01-29 LAB — CBC
HCT: 43.6 % (ref 36.0–46.0)
Hemoglobin: 14.3 g/dL (ref 12.0–15.0)
MCH: 28.4 pg (ref 26.0–34.0)
MCHC: 32.8 g/dL (ref 30.0–36.0)
MCV: 86.7 fL (ref 80.0–100.0)
Platelets: 281 10*3/uL (ref 150–400)
RBC: 5.03 MIL/uL (ref 3.87–5.11)
RDW: 11.9 % (ref 11.5–15.5)
WBC: 6.4 10*3/uL (ref 4.0–10.5)
nRBC: 0 % (ref 0.0–0.2)

## 2019-01-29 MED ORDER — HYDROCODONE-ACETAMINOPHEN 5-325 MG PO TABS: 1.0000 | ORAL_TABLET | Freq: Four times a day (QID) | ORAL | 0 refills | Status: AC | PRN

## 2019-01-29 MED ORDER — IOHEXOL 300 MG/ML  SOLN
100.0000 mL | Freq: Once | INTRAMUSCULAR | Status: AC | PRN
  Administered 2019-01-29: 100 mL via INTRAVENOUS

## 2019-01-29 NOTE — Progress Notes (Signed)
[facility]  Patient saw Dr. Laural Golden today and still with pain on that right side. First spoke with her mother and then spoke with patient.   Patient having a sharp pain / cramp on there right side lower down. She is hurting to walk and stand up straight. She says she is having pain with walking. She has been in bed for 3 days.   No having fevers and eating and having some diarrhea.  The muscle relaxer did not help.  She has been sent to the hospital for labs and CT scan by Dr. Laural Golden.  I completely agree. The last time I talked with Georgetta she was having the right sided pain and I thought that it could possibly be from a hematoma at her trocar sites. This will help [place] rule out anything bad or make sure we are not missing anything.  Will get back in touch with patient after the studies are done.   Curlene Labrum, MD [facility] [address] [place], [place] [address] [id number] [phone] (office)

## 2019-01-29 NOTE — Progress Notes (Signed)
Presenting complaint;  Right-sided abdominal pain.  Database and subjective:  Patient is 23 year old female who was last seen on 11/05/2018 for elevated transaminases.  She had been on spironolactone for acne which was felt to be the source of her elevated transaminases.  However work-up revealed cholelithiasis. HCV antibody was negative.  Serologic testing revealed immunity to hepatitis A and B due to prior vaccination.  Sed rate was 5. Based on her symptoms I felt she may have passed a stone.  Cholecystectomy was recommended. She also complained of postprandial cramping and diarrhea and I felt she had IBS and she was given prescription for dicyclomine.  Patient underwent laparoscopic cholecystectomy with liver biopsy on 01/16/2019.  Patient states she did well for 2 days.  Then she began to have pain in the right side of her abdomen.  She points to mid and lower quadrant as the site of pain.  She noted some relief with pain medication which she has finished.  She she has been in touch with Dr. Constance Haw.  She gradually started to feel better but for the last 2 days she has noted worsening pain.  This pain does not radiate into her back chest or shoulder.  She has more pain when she takes a deep breath or when she walks.  She feels some better when she lies still in bed.  She has not experience fever nausea vomiting or chills.  She has been having diarrhea.  She has not noted any relief with dicyclomine and or ibuprofen.  Yesterday she passed bright red blood with her bowel movement.  She says it turned while on the commode red and there was some of the tissue.  She says she has had bleeding secondary fissure in the past but this time she did not experience any pain.  She did not see any blood today.  Her appetite is fair.  She has lost 5 pounds since surgery. Patient also wants to know the results of liver biopsy. She denies hematuria or dysuria.  Current Medications: Outpatient Encounter Medications  as of 01/29/2019  Medication Sig  . cyclobenzaprine (FLEXERIL) 5 MG tablet Take 1 tablet (5 mg total) by mouth 3 (three) times daily as needed for muscle spasms.  Marland Kitchen docusate sodium (COLACE) 100 MG capsule Take 1 capsule (100 mg total) by mouth 2 (two) times daily.  . drospirenone-ethinyl estradiol (YASMIN) 3-0.03 MG tablet Take 1 tablet by mouth daily.  . hydroxypropyl methylcellulose / hypromellose (ISOPTO TEARS / GONIOVISC) 2.5 % ophthalmic solution Place 1 drop into both eyes 2 (two) times daily.  Marland Kitchen ibuprofen (ADVIL) 200 MG tablet Take 200 mg by mouth. Patient states that she takes 2 in the morning and 2 at night. If the pain is bad she will take 2 in the afternoon.  . tretinoin (RETIN-A) 0.025 % cream Apply 1 application topically at bedtime.   . dicyclomine (BENTYL) 10 MG capsule TAKE 1 CAPSULE (10 MG TOTAL) BY MOUTH 3 (THREE) TIMES DAILY BEFORE MEALS. (Patient not taking: Reported on 01/29/2019)  . [DISCONTINUED] HYDROcodone-acetaminophen (NORCO/VICODIN) 5-325 MG tablet Take 1 tablet by mouth every 4 (four) hours as needed for severe pain. (Patient not taking: Reported on 01/29/2019)  . [DISCONTINUED] ondansetron (ZOFRAN) 4 MG tablet Take 1 tablet (4 mg total) by mouth daily as needed for nausea or vomiting. (Patient not taking: Reported on 01/29/2019)   No facility-administered encounter medications on file as of 01/29/2019.     Objective: Blood pressure 122/84, pulse 98, temperature 97.8 F (36.6  C), temperature source Oral, height 5' 7.5" (1.715 m), weight 139 lb 8 oz (63.3 kg), last menstrual period 01/16/2019. Patient is alert and she does not appear to be feeling well. Conjunctiva is pink. Sclera is nonicteric Oropharyngeal mucosa is normal. No neck masses or thyromegaly noted. Cardiac exam with regular rhythm normal S1 and S2. No murmur or gallop noted. Lungs are clear to auscultation. Abdomen is flat.  She has for laparoscopy port sites without any drainage.  Bowel sounds are  normal.  On palpation abdomen is soft.  She has mild tenderness in right lower quadrant without guarding but she has moderate tenderness below the right costal margin with some guarding. No LE edema or clubbing noted.  Labs/studies Results:  CBC Latest Ref Rng & Units 01/14/2019 12/24/2018 11/12/2018  WBC 4.0 - 10.5 K/uL 4.7 3.8 4.2  Hemoglobin 12.0 - 15.0 g/dL 13.4 14.3 13.1  Hematocrit 36.0 - 46.0 % 40.8 41.9 39.4  Platelets 150 - 400 K/uL 232 259 226    CMP Latest Ref Rng & Units 01/14/2019 12/24/2018 11/12/2018  Glucose 70 - 99 mg/dL 101(H) - -  BUN 6 - 20 mg/dL 11 - -  Creatinine 0.44 - 1.00 mg/dL 0.75 - -  Sodium 135 - 145 mmol/L 137 - -  Potassium 3.5 - 5.1 mmol/L 3.7 - -  Chloride 98 - 111 mmol/L 103 - -  CO2 22 - 32 mmol/L 23 - -  Calcium 8.9 - 10.3 mg/dL 9.1 - -  Total Protein 6.5 - 8.1 g/dL 6.6 6.8 6.5  Total Bilirubin 0.3 - 1.2 mg/dL 0.7 0.5 0.4  Alkaline Phos 38 - 126 U/L 24(L) 36(L) 39  AST 15 - 41 U/L _0 ALT 0 - 44 U/L 20 25 57(H)    Hepatic Function Latest Ref Rng & Units 01/14/2019 12/24/2018 11/12/2018  Total Protein 6.5 - 8.1 g/dL 6.6 6.8 6.5  Albumin 3.5 - 5.0 g/dL 3.7 4.3 4.0  AST 15 - 41 U/L _1 ALT 0 - 44 U/L 20 25 57(H)  Alk Phosphatase 38 - 126 U/L 24(L) 36(L) 39  Total Bilirubin 0.3 - 1.2 mg/dL 0.7 0.5 0.4  Bilirubin, Direct 0.00 - 0.40 mg/dL - 0.14 0.12    These are preprocedure labs.  Assessment:  #1.  Abdominal pain.  She has both right lower quadrant and right upper quadrant abdominal pain.  She has significant tenderness with guarding in the right upper quadrant.  She is 13 days status post laparoscopic cholecystectomy and liver biopsy.  Etiology of this pain is unclear but I do not believe all of this pain is due to IBS.  She could have delayed hemorrhage for surgery which might explain her symptoms.  Similarly biliary leak needs to be mentioned although she is not tachycardic diaphoretic or febrile today.  She needs to be further  evaluated with lab studies and CT as soon as possible.  #2.  History of elevated transaminases felt to be due to choledocholithiasis.  She did have liver biopsy at the time of cholecystectomy as recommended.  There is no biopsy report in epic.  Therefore I called the pathology department at [facility].  Liver slides have been sent to [facility] for second opinion.  Dr. Jeannie Done of pathology department is on vacation.  I have requested if one of her colleagues could call me with preliminary diagnosis.  #3.  Hematochezia.  She has history of anal fissure but she is not having any pain on defecation.  Suspect hemorrhoidal bleeding.  Since she has had diarrhea may consider endoscopic evaluation at a later date when acute illness subsides   Plan:  Hydrocodone/acetaminophen 5/325 1 tablet every 6 as needed.  Prescription given for 20 doses. Patient will go to the lab for CBC and comprehensive chemistry panel. Abdominal pelvic CT with contrast as soon as possible. Further recommendations to follow. Office visit in 4 weeks.

## 2019-01-29 NOTE — Patient Instructions (Signed)
Abdominal pelvic CT to be scheduled as soon as possible. Physician will call with results of CT and liver biopsy results.

## 2019-02-03 ENCOUNTER — Telehealth: Payer: Self-pay | Admitting: General Surgery

## 2019-02-03 NOTE — Telephone Encounter (Signed)
[facility]  Called to check on the patient. She says she is feeling much better.  The pain in her side is better and that it is like a 0.5 on the pain scale.  Overall feeling much better.  Will update on pathology once available from [facility].  Curlene Labrum, MD [facility] [address] [place], [place] [address] [id number] [phone] (office)

## 2019-02-06 ENCOUNTER — Encounter: Payer: Self-pay | Admitting: General Surgery

## 2019-02-11 ENCOUNTER — Encounter (HOSPITAL_COMMUNITY): Payer: Self-pay

## 2019-02-22 ENCOUNTER — Other Ambulatory Visit: Payer: 59

## 2019-02-22 ENCOUNTER — Other Ambulatory Visit: Payer: Self-pay

## 2019-02-22 ENCOUNTER — Ambulatory Visit: Payer: 59 | Attending: Internal Medicine

## 2019-02-22 DIAGNOSIS — Z20822 Contact with and (suspected) exposure to covid-19: Secondary | ICD-10-CM

## 2019-02-23 LAB — NOVEL CORONAVIRUS, NAA: SARS-CoV-2, NAA: NOT DETECTED

## 2019-02-25 ENCOUNTER — Encounter (INDEPENDENT_AMBULATORY_CARE_PROVIDER_SITE_OTHER): Payer: Self-pay

## 2019-03-04 ENCOUNTER — Encounter (INDEPENDENT_AMBULATORY_CARE_PROVIDER_SITE_OTHER): Payer: Self-pay | Admitting: Internal Medicine

## 2019-03-04 ENCOUNTER — Ambulatory Visit (INDEPENDENT_AMBULATORY_CARE_PROVIDER_SITE_OTHER): Payer: 59 | Admitting: Internal Medicine

## 2019-03-04 ENCOUNTER — Other Ambulatory Visit: Payer: Self-pay

## 2019-03-04 VITALS — BP 114/75 | HR 88 | Temp 97.9°F | Ht 67.5 in | Wt 143.9 lb

## 2019-03-04 DIAGNOSIS — K589 Irritable bowel syndrome without diarrhea: Secondary | ICD-10-CM | POA: Insufficient documentation

## 2019-03-04 DIAGNOSIS — R7989 Other specified abnormal findings of blood chemistry: Secondary | ICD-10-CM

## 2019-03-04 DIAGNOSIS — K580 Irritable bowel syndrome with diarrhea: Secondary | ICD-10-CM | POA: Diagnosis not present

## 2019-03-04 DIAGNOSIS — K644 Residual hemorrhoidal skin tags: Secondary | ICD-10-CM

## 2019-03-04 DIAGNOSIS — R945 Abnormal results of liver function studies: Secondary | ICD-10-CM | POA: Diagnosis not present

## 2019-03-04 MED ORDER — NYSTATIN-TRIAMCINOLONE 100000-0.1 UNIT/GM-% EX CREA: 1.0000 "application " | TOPICAL_CREAM | Freq: Two times a day (BID) | CUTANEOUS | 1 refills | Status: DC

## 2019-03-04 NOTE — Patient Instructions (Signed)
Increase fiber intake as tolerated. Use Mycolog-II cream/ointment as directed twice daily for 2 weeks and thereafter on as-needed basis Please call office with progress report in 2 weeks.

## 2019-03-04 NOTE — Progress Notes (Signed)
Presenting complaint;  Diarrhea perianal discomfort. History of elevated transaminases. Also wants to discuss liver biopsy results which she reviewed in my chart  Database and subjective:  Patient is 24 year old Caucasian female who was initially seen in September 2020 for AST of 100 and ALT of 182.  There was a question of transaminitis secondary to spironolactone which she had taken for about 4 months.  Medication was discontinued. Further work-up revealed that she is immune to hepatitis a and B. Hep C antibody was negative.  Repeat LFTs revealed AST of 26 and ALT of 57 on 11/12/2018.  Ultrasound revealed gallstones. I felt bump in transaminases may have been due to choledocholithiasis.  I recommended cholecystectomy with liver biopsy.  Based on her symptoms also felt she had IBS and recommended dicyclomine but she has not taken it yet. She had laparoscopic surgery on 01/16/2019.  Liver biopsy slides were sent to [facility] for second opinion.  It reveals mild chronic inflammatory cells and portal areas with minimal portal fibrosis but no evidence of steatosis or piecemeal necrosis.  Portal fibrosis was felt to be due to superficial nature of the biopsy. Patient was last seen on 01/29/2019 for excruciating right upper quadrant abdominal pain.  CT revealed small amount of fluid collection in gallbladder fossa but there was no large fluid collection.  She also had trace free fluid in cul-de-sac felt to be physiologic given her age.  She was treated with pain medication and her pain gradually resolved.  She also had blood work at that time.  CBC and comprehensive chemistry panel was normal.  Now she presents with diarrhea which she experienced on most days.  She has anywhere from 3-6 stools per day.  She denies rectal bleeding or nocturnal diarrhea.  She says she has had 4 episodes of mild nausea in the last 6 weeks.  She has not experienced any vomiting.  Over the last few days she has noted anorectal burning  with a bowel movement.  She has noted slight lump at anal orifice.  Her appetite is good.  She has gained 4 pounds since her last visit 1 month ago.   Current Medications: Outpatient Encounter Medications as of 03/04/2019  Medication Sig  . drospirenone-ethinyl estradiol (YASMIN) 3-0.03 MG tablet Take 1 tablet by mouth daily.  . hydroxypropyl methylcellulose / hypromellose (ISOPTO TEARS / GONIOVISC) 2.5 % ophthalmic solution Place 1 drop into both eyes 2 (two) times daily.  Marland Kitchen tretinoin (RETIN-A) 0.025 % cream Apply 1 application topically at bedtime.   . dicyclomine (BENTYL) 10 MG capsule TAKE 1 CAPSULE (10 MG TOTAL) BY MOUTH 3 (THREE) TIMES DAILY BEFORE MEALS. (Patient not taking: Reported on 01/29/2019)  . docusate sodium (COLACE) 100 MG capsule Take 1 capsule (100 mg total) by mouth 2 (two) times daily. (Patient not taking: Reported on 03/04/2019)  . ibuprofen (ADVIL) 200 MG tablet Take 200 mg by mouth. Patient states that she takes 2 in the morning and 2 at night. If the pain is bad she will take 2 in the afternoon.  . [DISCONTINUED] cyclobenzaprine (FLEXERIL) 5 MG tablet Take 1 tablet (5 mg total) by mouth 3 (three) times daily as needed for muscle spasms. (Patient not taking: Reported on 03/04/2019)   No facility-administered encounter medications on file as of 03/04/2019.     Objective: Blood pressure 114/75, pulse 88, temperature 97.9 F (36.6 C), temperature source Temporal, height 5' 7.5" (1.715 m), weight 143 lb 14.4 oz (65.3 kg). Patient is alert and in no acute  distress. Patient is wearing a facial mask. Conjunctiva is pink. Sclera is nonicteric Oropharyngeal mucosa is normal. No neck masses or thyromegaly noted. Cardiac exam with regular rhythm normal S1 and S2. No murmur or gallop noted. Lungs are clear to auscultation. Abdomen is symmetrical.  She has laparoscopy scars from recent cholecystectomy.  Bowel sounds are normal.  On palpation abdomen is soft and nontender with  organomegaly or masses. Rectal examination was limited to external inspection.  She has small anal tag located in the midline anteriorly.  There is a small break in the skin along posterior aspect of this anal tag.  It does not extend into the anal canal. No LE edema or clubbing noted.  Labs/studies Results:  CBC Latest Ref Rng & Units 01/29/2019 01/14/2019 12/24/2018  WBC 4.0 - 10.5 K/uL 6.4 4.7 3.8  Hemoglobin 12.0 - 15.0 g/dL 14.3 13.4 14.3  Hematocrit 36.0 - 46.0 % 43.6 40.8 41.9  Platelets 150 - 400 K/uL 281 232 259    CMP Latest Ref Rng & Units 01/29/2019 01/14/2019 12/24/2018  Glucose 70 - 99 mg/dL 95 101(H) -  BUN 6 - 20 mg/dL 13 11 -  Creatinine 0.44 - 1.00 mg/dL 0.79 0.75 -  Sodium 135 - 145 mmol/L 138 137 -  Potassium 3.5 - 5.1 mmol/L 4.4 3.7 -  Chloride 98 - 111 mmol/L 102 103 -  CO2 22 - 32 mmol/L 23 23 -  Calcium 8.9 - 10.3 mg/dL 9.5 9.1 -  Total Protein 6.5 - 8.1 g/dL 7.8 6.6 6.8  Total Bilirubin 0.3 - 1.2 mg/dL 0.6 0.7 0.5  Alkaline Phos 38 - 126 U/L 32(L) 24(L) 36(L)  AST 15 - 41 U/L _0 ALT 0 - 44 U/L _1 Hepatic Function Latest Ref Rng & Units 01/29/2019 01/14/2019 12/24/2018  Total Protein 6.5 - 8.1 g/dL 7.8 6.6 6.8  Albumin 3.5 - 5.0 g/dL 4.3 3.7 4.3  AST 15 - 41 U/L _2 ALT 0 - 44 U/L _3 Alk Phosphatase 38 - 126 U/L 32(L) 24(L) 36(L)  Total Bilirubin 0.3 - 1.2 mg/dL 0.6 0.7 0.5  Bilirubin, Direct 0.00 - 0.40 mg/dL - - 0.14      Assessment:  #1.  History of elevated transaminases in retrospect what appeared to be due to passage of common duct stones.  Transaminases in the last 2 months have been normal. Liver biopsy showed mild chronic inflammatory cells in portal areas which may be related to choledocholithiasis.  These changes nevertheless are nonspecific and not indicated other conditions such as primary biliary cholangitis.  Since liver biopsy raises some concerns will continue to monitor LFTs closely.  #2.  IBS.  Suspect  diarrhea due to irritable bowel syndrome.  No other alarm symptoms.  If she does not respond to dicyclomine would consider further work-up to rule out inflammatory bowel disease as well as celiac disease.  Please note that she has prescription for dicyclomine but has not yet used it.  #3.  Inflamed anal tag resulting from diarrhea.  Plan:  Patient reassured about results of liver biopsy.  I told her I do not believe she has chronic condition. She will have LFTs repeated in 6 months and thereafter every year. Patient advised to go back on dicyclomine 10 mg before breakfast and lunch and if necessary she can take third dose before supper. Mycolog-II cream to be applied to perianal area twice daily for 2 weeks and thereafter on  as needed basis. Patient will call with progress report in 6 weeks. As above LFTs in 6 months. Office visit in 1 year.

## 2019-03-07 ENCOUNTER — Ambulatory Visit (INDEPENDENT_AMBULATORY_CARE_PROVIDER_SITE_OTHER): Payer: 59 | Admitting: Internal Medicine

## 2019-05-06 ENCOUNTER — Encounter (INDEPENDENT_AMBULATORY_CARE_PROVIDER_SITE_OTHER): Payer: Self-pay

## 2019-05-07 ENCOUNTER — Other Ambulatory Visit: Payer: Self-pay

## 2019-05-07 ENCOUNTER — Other Ambulatory Visit (INDEPENDENT_AMBULATORY_CARE_PROVIDER_SITE_OTHER): Payer: Self-pay | Admitting: *Deleted

## 2019-05-07 ENCOUNTER — Other Ambulatory Visit: Payer: 59

## 2019-05-07 DIAGNOSIS — R109 Unspecified abdominal pain: Secondary | ICD-10-CM

## 2019-05-07 DIAGNOSIS — R7989 Other specified abnormal findings of blood chemistry: Secondary | ICD-10-CM

## 2019-05-07 DIAGNOSIS — R945 Abnormal results of liver function studies: Secondary | ICD-10-CM

## 2019-05-09 LAB — HEPATIC FUNCTION PANEL
ALT: 24 IU/L (ref 0–32)
AST: 20 IU/L (ref 0–40)
Albumin: 4.5 g/dL (ref 3.9–5.0)
Alkaline Phosphatase: 35 IU/L — ABNORMAL LOW (ref 39–117)
Bilirubin Total: <0.2 mg/dL (ref 0.0–1.2)
Bilirubin, Direct: 0.08 mg/dL (ref 0.00–0.40)
Total Protein: 6.7 g/dL (ref 6.0–8.5)

## 2019-05-09 LAB — CELIAC AB TTG DGP TIGA
Antigliadin Abs, IgA: 5 units (ref 0–19)
Gliadin IgG: 3 units (ref 0–19)
IgA/Immunoglobulin A, Serum: 146 mg/dL (ref 87–352)
Tissue Transglut Ab: 5 U/mL (ref 0–5)
Transglutaminase IgA: <2 U/mL (ref 0–3)

## 2019-05-16 ENCOUNTER — Other Ambulatory Visit: Payer: 59

## 2019-06-03 ENCOUNTER — Encounter (INDEPENDENT_AMBULATORY_CARE_PROVIDER_SITE_OTHER): Payer: Self-pay

## 2019-06-03 ENCOUNTER — Telehealth (INDEPENDENT_AMBULATORY_CARE_PROVIDER_SITE_OTHER): Payer: Self-pay | Admitting: *Deleted

## 2019-06-03 NOTE — Telephone Encounter (Signed)
Hi, I forgot to follow up since upping the dicyclomine to two pills before breakfast and two before lunch. I feel that it has been helping me during the day however, about 5-15 minutes after breakfast I have stomach cramping and typically have to rush to use the bathroom. I don't know if that is normal but I wanted to give you my update.

## 2019-07-03 ENCOUNTER — Ambulatory Visit: Payer: 59

## 2019-07-07 ENCOUNTER — Other Ambulatory Visit (INDEPENDENT_AMBULATORY_CARE_PROVIDER_SITE_OTHER): Payer: Self-pay | Admitting: Internal Medicine

## 2019-07-19 ENCOUNTER — Encounter (INDEPENDENT_AMBULATORY_CARE_PROVIDER_SITE_OTHER): Payer: Self-pay

## 2019-08-30 ENCOUNTER — Encounter (INDEPENDENT_AMBULATORY_CARE_PROVIDER_SITE_OTHER): Payer: Self-pay | Admitting: *Deleted

## 2019-08-30 ENCOUNTER — Encounter (INDEPENDENT_AMBULATORY_CARE_PROVIDER_SITE_OTHER): Payer: Self-pay

## 2019-09-09 ENCOUNTER — Telehealth: Payer: Self-pay | Admitting: Family Medicine

## 2019-09-09 NOTE — Telephone Encounter (Signed)
Printed off shot record from epic and ncir and put up front

## 2019-09-30 ENCOUNTER — Other Ambulatory Visit (INDEPENDENT_AMBULATORY_CARE_PROVIDER_SITE_OTHER): Payer: Self-pay | Admitting: Gastroenterology

## 2019-09-30 MED ORDER — DICYCLOMINE HCL 10 MG PO CAPS: 10.0000 mg | ORAL_CAPSULE | Freq: Three times a day (TID) | ORAL | 1 refills | Status: DC

## 2019-09-30 NOTE — Progress Notes (Signed)
Electronic refill request filled.

## 2019-10-10 ENCOUNTER — Encounter (INDEPENDENT_AMBULATORY_CARE_PROVIDER_SITE_OTHER): Payer: Self-pay

## 2019-10-22 ENCOUNTER — Other Ambulatory Visit: Payer: Self-pay

## 2019-10-22 ENCOUNTER — Encounter (INDEPENDENT_AMBULATORY_CARE_PROVIDER_SITE_OTHER): Payer: Self-pay

## 2019-10-22 ENCOUNTER — Ambulatory Visit (INDEPENDENT_AMBULATORY_CARE_PROVIDER_SITE_OTHER): Payer: 59 | Admitting: Internal Medicine

## 2019-10-22 ENCOUNTER — Encounter (INDEPENDENT_AMBULATORY_CARE_PROVIDER_SITE_OTHER): Payer: Self-pay | Admitting: Internal Medicine

## 2019-10-22 VITALS — BP 135/90 | HR 97 | Temp 98.7°F | Ht 67.5 in | Wt 146.9 lb

## 2019-10-22 DIAGNOSIS — K529 Noninfective gastroenteritis and colitis, unspecified: Secondary | ICD-10-CM

## 2019-10-22 DIAGNOSIS — K219 Gastro-esophageal reflux disease without esophagitis: Secondary | ICD-10-CM | POA: Diagnosis not present

## 2019-10-22 MED ORDER — FAMOTIDINE 40 MG PO TABS: 40.0000 mg | ORAL_TABLET | Freq: Every day | ORAL | 5 refills | Status: DC

## 2019-10-22 MED ORDER — CHOLESTYRAMINE 4 G PO PACK: 4.0000 g | PACK | Freq: Two times a day (BID) | ORAL | 2 refills | Status: DC

## 2019-10-22 NOTE — Progress Notes (Signed)
Presenting complaint;  Worsening diarrhea nausea heartburn and burping.  Database and subjective:  Patient is 24 year old Caucasian female who is here for scheduled visit.  She was initially seen 1 year ago for abnormal LFTs.  On work-up was felt that she may have passed a stone.  She had cholelithiasis.  She went on to have cholecystectomy in December 2020.  Postop she had acute pain but no evidence of biliary leak.  She is also felt to have IBS and has been doing better with dicyclomine. She now presents with change in her symptoms. She states her diarrhea has worsened.  She has bowel movements within 15 to 20 minutes of meals.  She says her stools are mushy to loose.  She has not had a normal stool recently and does not remember being constipated.  She is having an average of 4-6 stools per day.  On her best day she has 2 stools.  On her worst days she is having as many as 9-10 stools per day.  She has urgency and cramping across lower abdomen.  She has not had any accidents.  She has noted blood on the tissue which she feels is due to hemorrhoids.  She has not experienced frank bleeding.  No recent antibiotics or travel.  She also complains of nausea but no vomiting.  Nausea occurs more when she is having abdominal cramping.  She burps all the time.  She is also having heartburn and regurgitation.  She tried IBgard but it caused her to be nauseated.  She stopped it.  She is taking dicyclomine 20 mg twice daily. She has not lost any weight despite her symptoms. Family history is negative for inflammatory bowel disease. She graduated from college earlier this year and now she works as a Education officer, museum at [employer]. She does not smoke cigarettes.  She drinks alcohol occasionally.  Current Medications: Outpatient Encounter Medications as of 10/22/2019  Medication Sig   dicyclomine (BENTYL) 10 MG capsule Take 1 capsule (10 mg total) by mouth 4 (four) times daily -  before meals and at  bedtime.   drospirenone-ethinyl estradiol (YASMIN) 3-0.03 MG tablet Take 1 tablet by mouth daily.   hydroxypropyl methylcellulose / hypromellose (ISOPTO TEARS / GONIOVISC) 2.5 % ophthalmic solution Place 1 drop into both eyes 2 (two) times daily.   tretinoin (RETIN-A) 0.025 % cream Apply 1 application topically at bedtime.    [DISCONTINUED] nystatin-triamcinolone (MYCOLOG II) cream Apply 1 application topically 2 (two) times daily. Apply twice daily as directed for 2 weeks and thereafter on as-needed basis (Patient not taking: Reported on 10/22/2019)   No facility-administered encounter medications on file as of 10/22/2019.    Objective: Blood pressure 135/90, pulse 97, temperature 98.7 F (37.1 C), temperature source Oral, height 5' 7.5" (1.715 m), weight 146 lb 14.4 oz (66.6 kg). Patient is alert and in no acute distress. Patient is wearing a mask. Conjunctiva is pink. Sclera is nonicteric Oropharyngeal mucosa is normal. No neck masses or thyromegaly noted. Cardiac exam with regular rhythm normal S1 and S2. No murmur or gallop noted. Lungs are clear to auscultation. Abdomen is symmetrical.  Bowel sounds are hyperactive.  On palpation abdomen is soft and nontender with organomegaly or masses. No LE edema or clubbing noted.  Labs/studies Results:  CBC Latest Ref Rng & Units 01/29/2019 01/14/2019 12/24/2018  WBC 4.0 - 10.5 K/uL 6.4 4.7 3.8  Hemoglobin 12.0 - 15.0 g/dL 14.3 13.4 14.3  Hematocrit 36 - 46 % 43.6 40.8 41.9  Platelets 150 - 400 K/uL 281 232 259    CMP Latest Ref Rng & Units 05/07/2019 01/29/2019 01/14/2019  Glucose 70 - 99 mg/dL - 95 101(H)  BUN 6 - 20 mg/dL - 13 11  Creatinine 0.44 - 1.00 mg/dL - 0.79 0.75  Sodium 135 - 145 mmol/L - 138 137  Potassium 3.5 - 5.1 mmol/L - 4.4 3.7  Chloride 98 - 111 mmol/L - 102 103  CO2 22 - 32 mmol/L - 23 23  Calcium 8.9 - 10.3 mg/dL - 9.5 9.1  Total Protein 6.0 - 8.5 g/dL 6.7 7.8 6.6  Total Bilirubin 0.0 - 1.2 mg/dL <0.2 0.6 0.7   Alkaline Phos 39 - 117 IU/L 35(L) 32(L) 24(L)  AST 0 - 40 IU/L 20 19 19   ALT 0 - 32 IU/L 24 26 20     Hepatic Function Latest Ref Rng & Units 05/07/2019 01/29/2019 01/14/2019  Total Protein 6.0 - 8.5 g/dL 6.7 7.8 6.6  Albumin 3.9 - 5.0 g/dL 4.5 4.3 3.7  AST 0 - 40 IU/L 20 19 19   ALT 0 - 32 IU/L 24 26 20   Alk Phosphatase 39 - 117 IU/L 35(L) 32(L) 24(L)  Total Bilirubin 0.0 - 1.2 mg/dL <0.2 0.6 0.7  Bilirubin, Direct 0.00 - 0.40 mg/dL 0.08 - -     Assessment:  #1.  Chronic diarrhea.  In the past I felt she has IBS.  She is not responding to dicyclomine anymore.  She is having anywhere from 2-10 bowel movements per day.  She is maintaining her weight. Last year she was also screened for celiac disease and testing was negative.  Infection less likely given chronicity but need to document negative stool study before considering diagnostic colonoscopy.  #2.  Frequent burping heartburn and regurgitation.  The symptoms may be due to primary GERD or secondary to lower GI symptoms.  Plan:  Patient will go to the lab for CBC with differential, CRP sed rate and GI pathogen panel. She will continue dicyclomine at current dose for now. Famotidine 40 mg by mouth daily at bedtime. Cholestyramine 4 g p.o. twice daily.  Patient advised to start this medication after she has taken stool sample to the lab. She was advised to take this medication 2 hours before or after taking other medications.  She may consider taking her contraceptive pill later in the evening to prevent interaction. If blood work and stool studies are negative and she does not respond to therapy will proceed with esophagogastroduodenoscopy and colonoscopy. Further recommendations to follow.

## 2019-10-22 NOTE — Patient Instructions (Signed)
Remember to take Cholestyramine two hours before or after taking other medications. Physician will call with results of pending blood work

## 2019-10-24 ENCOUNTER — Other Ambulatory Visit: Payer: Self-pay

## 2019-10-24 ENCOUNTER — Encounter (INDEPENDENT_AMBULATORY_CARE_PROVIDER_SITE_OTHER): Payer: Self-pay

## 2019-10-24 ENCOUNTER — Other Ambulatory Visit (HOSPITAL_COMMUNITY)
Admission: RE | Admit: 2019-10-24 | Discharge: 2019-10-24 | Disposition: A | Discharge status: Home / Self Care | Payer: 59 | Source: Ambulatory Visit | Attending: Internal Medicine | Admitting: Internal Medicine

## 2019-10-24 DIAGNOSIS — K529 Noninfective gastroenteritis and colitis, unspecified: Secondary | ICD-10-CM | POA: Diagnosis present

## 2019-10-24 LAB — CBC WITH DIFFERENTIAL/PLATELET
Abs Immature Granulocytes: 0.01 10*3/uL (ref 0.00–0.07)
Basophils Absolute: 0 10*3/uL (ref 0.0–0.1)
Basophils Relative: 1 %
Eosinophils Absolute: 0.1 10*3/uL (ref 0.0–0.5)
Eosinophils Relative: 1 %
HCT: 43.4 % (ref 36.0–46.0)
Hemoglobin: 14.1 g/dL (ref 12.0–15.0)
Immature Granulocytes: 0 %
Lymphocytes Relative: 35 %
Lymphs Abs: 1.8 10*3/uL (ref 0.7–4.0)
MCH: 29 pg (ref 26.0–34.0)
MCHC: 32.5 g/dL (ref 30.0–36.0)
MCV: 89.1 fL (ref 80.0–100.0)
Monocytes Absolute: 0.4 10*3/uL (ref 0.1–1.0)
Monocytes Relative: 8 %
Neutro Abs: 2.9 10*3/uL (ref 1.7–7.7)
Neutrophils Relative %: 55 %
Platelets: 264 10*3/uL (ref 150–400)
RBC: 4.87 MIL/uL (ref 3.87–5.11)
RDW: 12.4 % (ref 11.5–15.5)
WBC: 5.2 10*3/uL (ref 4.0–10.5)
nRBC: 0 % (ref 0.0–0.2)

## 2019-10-24 LAB — C-REACTIVE PROTEIN: CRP: 0.6 mg/dL (ref <1.0)

## 2019-10-24 LAB — SEDIMENTATION RATE: Sed Rate: 2 mm/hr (ref 0–22)

## 2019-10-28 ENCOUNTER — Telehealth (INDEPENDENT_AMBULATORY_CARE_PROVIDER_SITE_OTHER): Payer: Self-pay

## 2019-10-28 LAB — GASTROINTESTINAL PANEL BY PCR, STOOL (REPLACES STOOL CULTURE)

## 2019-10-28 NOTE — Telephone Encounter (Signed)
Dr. Laural Golden text back that he would look at this information.

## 2019-10-30 ENCOUNTER — Encounter: Payer: Self-pay | Admitting: Family Medicine

## 2019-10-30 ENCOUNTER — Ambulatory Visit (INDEPENDENT_AMBULATORY_CARE_PROVIDER_SITE_OTHER): Payer: 59 | Admitting: Family Medicine

## 2019-10-30 DIAGNOSIS — J0100 Acute maxillary sinusitis, unspecified: Secondary | ICD-10-CM | POA: Diagnosis not present

## 2019-10-30 MED ORDER — PSEUDOEPHEDRINE-GUAIFENESIN ER 120-1200 MG PO TB12: 1.0000 | ORAL_TABLET | Freq: Two times a day (BID) | ORAL | 0 refills | Status: DC

## 2019-10-30 MED ORDER — AMOXICILLIN-POT CLAVULANATE 875-125 MG PO TABS: 1.0000 | ORAL_TABLET | Freq: Two times a day (BID) | ORAL | 0 refills | Status: DC

## 2019-10-30 NOTE — Progress Notes (Signed)
No answer 8:15    Subjective:    Patient ID: Brianna Bridges, female    DOB: 22-Jul-1995, 24 y.o.   MRN: [id number]   HPI: Brianna Bridges is a 25 y.o. female presenting for bad head and nose congestion. Ears popping all the time. Onset over a month ago. Has had drainage in throat ever since. Blowing her nose a lot. Coughing yellow from both.  Patient is a Adult nurse at [employer].  Health at work sent her for Covid testing yesterday.  That came back negative.  nt is Depression screen [facility] 2/9 09/21/2016 06/08/2016 04/15/2016 02/11/2016 09/02/2015  Decreased Interest 0 0 0 0 0  Down, Depressed, Hopeless 0 0 0 0 0  PHQ - 2 Score 0 0 0 0 0     Relevant past medical, surgical, family and social history reviewed and updated as indicated.  Interim medical history since our last visit reviewed. Allergies and medications reviewed and updated.  ROS:  Review of Systems  Constitutional: Negative for fever.  HENT: Positive for congestion, postnasal drip, rhinorrhea and sinus pressure. Negative for ear discharge, ear pain, hearing loss, nosebleeds, sneezing and trouble swallowing.   Respiratory: Positive for cough.   Skin: Negative for rash.     Social History   Tobacco Use  Smoking Status Never Smoker  Smokeless Tobacco Never Used       Objective:     Wt Readings from Last 3 Encounters:  10/22/19 146 lb 14.4 oz (66.6 kg)  03/04/19 143 lb 14.4 oz (65.3 kg)  01/29/19 139 lb 8 oz (63.3 kg)     Exam deferred. Pt. Harboring due to COVID 19. Phone visit performed.   Assessment & Plan:   1. Acute maxillary sinusitis, recurrence not specified     Meds ordered this encounter  Medications  . DISCONTD: amoxicillin-clavulanate (AUGMENTIN) 875-125 MG tablet    Sig: Take 1 tablet by mouth 2 (two) times daily. Take all of this medication    Dispense:  20 tablet    Refill:  0  . DISCONTD: Pseudoephedrine-Guaifenesin [phone] MG TB12    Sig: Take 1 tablet by mouth 2 (two)  times daily. For congestion    Dispense:  20 tablet    Refill:  0  . amoxicillin-clavulanate (AUGMENTIN) 875-125 MG tablet    Sig: Take 1 tablet by mouth 2 (two) times daily. Take all of this medication    Dispense:  20 tablet    Refill:  0  . Pseudoephedrine-Guaifenesin [phone] MG TB12    Sig: Take 1 tablet by mouth 2 (two) times daily. For congestion    Dispense:  20 tablet    Refill:  0    No orders of the defined types were placed in this encounter.     Diagnoses and all orders for this visit:  Acute maxillary sinusitis, recurrence not specified  Other orders -     Discontinue: amoxicillin-clavulanate (AUGMENTIN) 875-125 MG tablet; Take 1 tablet by mouth 2 (two) times daily. Take all of this medication -     Discontinue: Pseudoephedrine-Guaifenesin [phone] MG TB12; Take 1 tablet by mouth 2 (two) times daily. For congestion -     amoxicillin-clavulanate (AUGMENTIN) 875-125 MG tablet; Take 1 tablet by mouth 2 (two) times daily. Take all of this medication -     Pseudoephedrine-Guaifenesin [phone] MG TB12; Take 1 tablet by mouth 2 (two) times daily. For congestion    Virtual Visit via telephone Note  I discussed the limitations,  risks, security and privacy concerns of performing an evaluation and management service by telephone and the availability of in person appointments. The patient was identified with two identifiers. Pt.expressed understanding and agreed to proceed. Pt. Is at home. Dr. Livia Snellen is in his office.  Follow Up Instructions:   I discussed the assessment and treatment plan with the patient. The patient was provided an opportunity to ask questions and all were answered. The patient agreed with the plan and demonstrated an understanding of the instructions.   The patient was advised to call back or seek an in-person evaluation if the symptoms worsen or if the condition fails to improve as anticipated.   Total minutes including chart review and phone contact  time: 12   Follow up plan: No follow-ups on file.  Claretta Fraise, MD [place]

## 2019-11-08 ENCOUNTER — Encounter (INDEPENDENT_AMBULATORY_CARE_PROVIDER_SITE_OTHER): Payer: Self-pay

## 2019-11-18 ENCOUNTER — Encounter (INDEPENDENT_AMBULATORY_CARE_PROVIDER_SITE_OTHER): Payer: Self-pay

## 2019-11-28 ENCOUNTER — Other Ambulatory Visit (INDEPENDENT_AMBULATORY_CARE_PROVIDER_SITE_OTHER): Payer: Self-pay | Admitting: Internal Medicine

## 2019-11-28 MED ORDER — HYOSCYAMINE SULFATE ER 0.375 MG PO TB12: 0.3750 mg | ORAL_TABLET | Freq: Every day | ORAL | 3 refills | Status: DC

## 2019-11-28 MED ORDER — RIFAXIMIN 550 MG PO TABS: 550.0000 mg | ORAL_TABLET | Freq: Three times a day (TID) | ORAL | 0 refills | Status: DC

## 2019-11-28 NOTE — Progress Notes (Signed)
Stool diary reviewed with patient.  She gets stool diary for about 20 days.  She only had 1 day with normal stool and 2 days without a bowel movement and rest of the days she has had diarrhea cramping and as many as four stools. She has been taking dicyclomine 40 mg/day as prescribed. She has not been taking cholestyramine as recommended because she was on antibiotic for sinus infection.  Will discontinue dicyclomine. Begin Levbid 0.375 mg p.o. every morning. Xifaxan 550 mg p.o. 3 times daily for 2 weeks.  Patient advised to take Xifaxan after she has been on Levbid for few days. Patient advised to take cholestyramine once a day maybe she can take in the afternoon. If symptoms persist with proceed with esophagogastroduodenoscopy and colonoscopy. Patient will call with progress report in few weeks.

## 2019-12-04 ENCOUNTER — Other Ambulatory Visit: Payer: Self-pay

## 2019-12-04 ENCOUNTER — Ambulatory Visit (INDEPENDENT_AMBULATORY_CARE_PROVIDER_SITE_OTHER): Payer: 59

## 2019-12-04 DIAGNOSIS — Z23 Encounter for immunization: Secondary | ICD-10-CM

## 2019-12-21 ENCOUNTER — Other Ambulatory Visit (INDEPENDENT_AMBULATORY_CARE_PROVIDER_SITE_OTHER): Payer: Self-pay | Admitting: Gastroenterology

## 2020-01-07 ENCOUNTER — Telehealth (INDEPENDENT_AMBULATORY_CARE_PROVIDER_SITE_OTHER): Payer: 59 | Admitting: Internal Medicine

## 2020-01-07 ENCOUNTER — Other Ambulatory Visit: Payer: Self-pay

## 2020-01-07 ENCOUNTER — Encounter (INDEPENDENT_AMBULATORY_CARE_PROVIDER_SITE_OTHER): Payer: Self-pay | Admitting: Internal Medicine

## 2020-01-07 VITALS — Ht 67.5 in | Wt 146.0 lb

## 2020-01-07 DIAGNOSIS — K219 Gastro-esophageal reflux disease without esophagitis: Secondary | ICD-10-CM | POA: Diagnosis not present

## 2020-01-07 DIAGNOSIS — K580 Irritable bowel syndrome with diarrhea: Secondary | ICD-10-CM | POA: Diagnosis not present

## 2020-01-07 MED ORDER — FAMOTIDINE 40 MG PO TABS: 40.0000 mg | ORAL_TABLET | Freq: Every day | ORAL | 1 refills | Status: DC

## 2020-01-07 NOTE — Progress Notes (Signed)
Virtual Visit via Video Note  I connected with TECLA MAILLOUX on 01/07/20 at 11:50 AM EST by a video enabled telemedicine application and verified that I am speaking with the correct person using two identifiers.  Location: Patient: home Provider: office   I discussed the limitations of evaluation and management by telemedicine and the availability of in person appointments. The patient expressed understanding and agreed to proceed.  History of Present Illness:  Patient is 24 year old Caucasian female with history of diarrhea urgency and abdominal pain felt to be due to IBS as well as history of GERD who was last seen in the office on 10/22/2019.  She did not respond to dicyclomine.  See was switched to Levbid/hyoscyamine.  She is not taking half a tablet every morning. Patient has noted symptomatic improvement.  She has been on half a tablet of Levbid for the last 1 week.  On most days she is having 1-2 bowel movements.  She has noted decrease in urgency.  She did have upset stomach yesterday after eating breakfast when she also had loose stool.  No history of melena or rectal bleeding or weight loss.  She feels heartburn is well controlled with famotidine but she burps often.  She reports dysphagia.  She states she has to drink water when she eats.  She has not had any episode of food impaction. She is taking cholestyramine 4 g daily. She is not having any side effects with hyoscyamine or famotidine.   Observations/Objective:  Weight reported to be 146 pounds. Patient noted to be in no distress during video call.  Lab data from 10/24/2019. GI pathogen panel on 10/24/2019 + for enteropathogenic E. coli. CRP was 0.6 Sed rate 2. CBC was normal.  Assessment and Plan:  #1.  IBS.  She has responded to combination of low-dose antispasmodic and cholestyramine.  Her IBS may have been made worse following cholecystectomy.  Will hold off further work-up at this time.  #2.  GERD.  She is doing  well with famotidine.  She is having dysphagia but has not had any episodes of food impaction.  Will monitor the symptom.  She may eventually need esophagogastroduodenoscopy.  Medication list updated. New prescription for famotidine sent to patient's pharmacy. Office visit in 6 months.  Follow Up Instructions:  Patient will call if diarrhea worsens or dysphagia becomes more frequent.  I discussed the assessment and treatment plan with the patient. The patient was provided an opportunity to ask questions and all were answered. The patient agreed with the plan and demonstrated an understanding of the instructions.   The patient was advised to call back or seek an in-person evaluation if the symptoms worsen or if the condition fails to improve as anticipated.  I provided 11 minutes of non-face-to-face time during this encounter.   Hildred Laser, MD

## 2020-01-16 ENCOUNTER — Other Ambulatory Visit (INDEPENDENT_AMBULATORY_CARE_PROVIDER_SITE_OTHER): Payer: Self-pay | Admitting: Internal Medicine

## 2020-01-16 NOTE — Telephone Encounter (Signed)
Last seen 10/22/2019 chronic diarrhea gerd

## 2020-01-30 ENCOUNTER — Telehealth: Payer: 59 | Admitting: Physician Assistant

## 2020-01-30 DIAGNOSIS — R059 Cough, unspecified: Secondary | ICD-10-CM | POA: Diagnosis not present

## 2020-01-30 DIAGNOSIS — U071 COVID-19: Secondary | ICD-10-CM

## 2020-01-30 MED ORDER — BENZONATATE 100 MG PO CAPS: 100.0000 mg | ORAL_CAPSULE | Freq: Three times a day (TID) | ORAL | 0 refills | Status: DC | PRN

## 2020-01-30 MED ORDER — FLUTICASONE PROPIONATE 50 MCG/ACT NA SUSP: 2.0000 | Freq: Every day | NASAL | 6 refills | Status: DC

## 2020-01-30 NOTE — Progress Notes (Signed)
E-Visit for Corona Virus Screening  We are sorry you are not feeling well. We are here to help!  You have tested positive for COVID-19, meaning that you were infected with the novel coronavirus and could give the virus to others.  It is vitally important that you stay home so you do not spread it to others.      Please continue isolation at home, for at least 10 days since the start of your symptoms and until you have had 24 hours with no fever (without taking a fever reducer) and with improving of symptoms.  Most cases improve 10 days from onset but we have seen a small number of patients who have gotten worse after the 10 days.  Please be sure to watch for worsening symptoms and remain taking the proper precautions.   Go to the nearest hospital ED for assessment if fever/cough/breathlessness are severe or illness seems like a threat to life.    The following symptoms may appear 2-14 days after exposure: . Fever . Cough . Shortness of breath or difficulty breathing . Chills . Repeated shaking with chills . Muscle pain . Headache . Sore throat . New loss of taste or smell . Fatigue . Congestion or runny nose . Nausea or vomiting . Diarrhea  You have been enrolled in [facility] for COVID-19. Daily you will receive a questionnaire within the [facility] website. Our COVID-19 response team will be monitoring your responses daily.  You can use medication such as A prescription cough medication called Tessalon Perles 100 mg. You may take 1-2 capsules every 8 hours as needed for cough and A prescription for Fluticasone nasal spray 2 sprays in each nostril one time per day  You have tested positive for Covid but because you are not considered high risk you do not qualify for monoclonal antibody infusion.  Supportive care is all that is needed.   You may also take acetaminophen (Tylenol) as needed for fever.  HOME CARE: . Only take medications as instructed by your medical  team. . Drink plenty of fluids and get plenty of rest. . A steam or ultrasonic humidifier can help if you have congestion.   GET HELP RIGHT AWAY IF YOU HAVE EMERGENCY WARNING SIGNS.  Call 911 or proceed to your closest emergency facility if: . You develop worsening high fever. . Trouble breathing . Bluish lips or face . Persistent pain or pressure in the chest . New confusion . Inability to wake or stay awake . You cough up blood. . Your symptoms become more severe . Inability to hold down food or fluids  This list is not all possible symptoms. Contact your medical provider for any symptoms that are severe or concerning to you.    Your e-visit answers were reviewed by a board certified advanced clinical practitioner to complete your personal care plan.  Depending on the condition, your plan could have included both over the counter or prescription medications.  If there is a problem please reply once you have received a response from your provider.  Your safety is important to [place].  If you have drug allergies check your prescription carefully.    You can use MyChart to ask questions about today's visit, request a non-urgent call back, or ask for a work or school excuse for 24 hours related to this e-Visit. If it has been greater than 24 hours you will need to follow up with your provider, or enter a new e-Visit to address those  concerns. You will get an e-mail in the next two days asking about your experience.  I hope that your e-visit has been valuable and will speed your recovery. Thank you for using e-visits.      Greater than 5 minutes, yet less than 10 minutes of time have been spent researching, coordinating and implementing care for this patient today.

## 2020-02-03 ENCOUNTER — Encounter: Payer: Self-pay | Admitting: Family Medicine

## 2020-02-03 ENCOUNTER — Telehealth: Payer: Self-pay | Admitting: Unknown Physician Specialty

## 2020-02-03 NOTE — Telephone Encounter (Signed)
Called to discuss with Brianna Bridges about Covid symptoms and the use of  monoclonal antibody infusion for those with mild to moderate Covid symptoms and at a high risk of hospitalization.     Pt is not qualified for this infusion due to lack of identified risk factors and co-morbid conditions.  Symptoms reviewed as well as criteria for ending isolation.  Symptoms reviewed that would warrant ED/Hospital evaluation as well should her condition worsen. Preventative practices reviewed. Patient verbalized understanding.

## 2020-02-11 ENCOUNTER — Other Ambulatory Visit: Payer: Self-pay

## 2020-02-11 ENCOUNTER — Emergency Department (HOSPITAL_COMMUNITY): Payer: 59

## 2020-02-11 ENCOUNTER — Emergency Department (HOSPITAL_COMMUNITY)
Admission: EM | Admit: 2020-02-11 | Discharge: 2020-02-11 | Disposition: A | Discharge status: Home / Self Care | Payer: 59 | Attending: Emergency Medicine | Admitting: Emergency Medicine

## 2020-02-11 ENCOUNTER — Encounter (HOSPITAL_COMMUNITY): Payer: Self-pay | Admitting: Emergency Medicine

## 2020-02-11 DIAGNOSIS — R9431 Abnormal electrocardiogram [ECG] [EKG]: Secondary | ICD-10-CM | POA: Diagnosis not present

## 2020-02-11 DIAGNOSIS — Z8616 Personal history of COVID-19: Secondary | ICD-10-CM | POA: Insufficient documentation

## 2020-02-11 DIAGNOSIS — R0789 Other chest pain: Secondary | ICD-10-CM

## 2020-02-11 DIAGNOSIS — M940 Chondrocostal junction syndrome [Tietze]: Secondary | ICD-10-CM | POA: Diagnosis not present

## 2020-02-11 DIAGNOSIS — R079 Chest pain, unspecified: Secondary | ICD-10-CM | POA: Diagnosis present

## 2020-02-11 LAB — CBC WITH DIFFERENTIAL/PLATELET
Abs Immature Granulocytes: 0.04 10*3/uL (ref 0.00–0.07)
Basophils Absolute: 0 10*3/uL (ref 0.0–0.1)
Basophils Relative: 1 %
Eosinophils Absolute: 0 10*3/uL (ref 0.0–0.5)
Eosinophils Relative: 0 %
HCT: 44.1 % (ref 36.0–46.0)
Hemoglobin: 14.6 g/dL (ref 12.0–15.0)
Immature Granulocytes: 1 %
Lymphocytes Relative: 29 %
Lymphs Abs: 2.5 10*3/uL (ref 0.7–4.0)
MCH: 28.9 pg (ref 26.0–34.0)
MCHC: 33.1 g/dL (ref 30.0–36.0)
MCV: 87.2 fL (ref 80.0–100.0)
Monocytes Absolute: 0.5 10*3/uL (ref 0.1–1.0)
Monocytes Relative: 5 %
Neutro Abs: 5.5 10*3/uL (ref 1.7–7.7)
Neutrophils Relative %: 64 %
Platelets: 375 10*3/uL (ref 150–400)
RBC: 5.06 MIL/uL (ref 3.87–5.11)
RDW: 12 % (ref 11.5–15.5)
WBC: 8.6 10*3/uL (ref 4.0–10.5)
nRBC: 0 % (ref 0.0–0.2)

## 2020-02-11 LAB — COMPREHENSIVE METABOLIC PANEL
ALT: 31 U/L (ref 0–44)
AST: 21 U/L (ref 15–41)
Albumin: 4.1 g/dL (ref 3.5–5.0)
Alkaline Phosphatase: 34 U/L — ABNORMAL LOW (ref 38–126)
Anion gap: 11 (ref 5–15)
BUN: 13 mg/dL (ref 6–20)
CO2: 25 mmol/L (ref 22–32)
Calcium: 9.5 mg/dL (ref 8.9–10.3)
Chloride: 104 mmol/L (ref 98–111)
Creatinine, Ser: 0.83 mg/dL (ref 0.44–1.00)
GFR, Estimated: >60 mL/min (ref >60)
Glucose, Bld: 100 mg/dL — ABNORMAL HIGH (ref 70–99)
Potassium: 3.9 mmol/L (ref 3.5–5.1)
Sodium: 140 mmol/L (ref 135–145)
Total Bilirubin: 0.7 mg/dL (ref 0.3–1.2)
Total Protein: 7.6 g/dL (ref 6.5–8.1)

## 2020-02-11 LAB — LIPASE, BLOOD: Lipase: 32 U/L (ref 11–51)

## 2020-02-11 LAB — I-STAT BETA HCG BLOOD, ED (MC, WL, AP ONLY): Blood: NEGATIVE

## 2020-02-11 LAB — TROPONIN I (HIGH SENSITIVITY): Troponin I (High Sensitivity): <2 ng/L (ref <18)

## 2020-02-11 MED ORDER — LACTATED RINGERS IV BOLUS
1000.0000 mL | Freq: Once | INTRAVENOUS | Status: AC
  Administered 2020-02-11: 1000 mL via INTRAVENOUS

## 2020-02-11 MED ORDER — IOHEXOL 350 MG/ML SOLN
100.0000 mL | Freq: Once | INTRAVENOUS | Status: AC | PRN
  Administered 2020-02-11: 100 mL via INTRAVENOUS

## 2020-02-11 NOTE — ED Notes (Signed)

## 2020-02-11 NOTE — ED Triage Notes (Signed)
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 Pt states that she did test positive for COVID on 01/29/20.

## 2020-02-11 NOTE — ED Notes (Signed)
PA at bedside.

## 2020-02-11 NOTE — Discharge Instructions (Addendum)
As we discussed today your EKG was slightly abnormal.  It was not consistent with a heart attack or other very concerning cause however I would recommend that you follow-up with your primary care doctor.  Please take Ibuprofen (Advil, motrin) and Tylenol (acetaminophen) to relieve your pain.    You may take up to 600 MG (3 pills) of normal strength ibuprofen every 8 hours as needed.  OR you may take 2 pills of aleve every 12 hours.   You make take tylenol, up to 1,000 mg (two extra strength pills) every 8 hours as needed.   It is safe to take ibuprofen and tylenol at the same time as they work differently.   Do not take more than 3,000 mg tylenol in a 24 hour period (not more than one dose every 8 hours.  Please check all medication labels as many medications such as pain and cold medications may contain tylenol.  Do not drink alcohol while taking these medications.  Do not take other NSAID'S while taking ibuprofen (such as aleve or naproxen).  Please take ibuprofen with food to decrease stomach upset.  Additionally I would recommend that you get Prilosec which is available over-the-counter and take this as soon as you wake up every morning.  This will help decrease any irritation in your stomach from ibuprofen or Aleve that you are taking.

## 2020-02-11 NOTE — ED Notes (Signed)
Pt returned from CT by CT staff; up and ambulatory to the restroom without assistance from staff at this time.

## 2020-02-11 NOTE — ED Provider Notes (Signed)
[facility] EMERGENCY DEPARTMENT Provider Note   CSN: [id number] Arrival date & time: 02/11/20  1703     History Chief Complaint  Patient presents with  . Chest Pain    Brianna Bridges is a 25 y.o. female who presents today for evaluation of chest pain and shortness of breath. She was diagnosed with Covid on 01/29/2020 and completed her quarantine.  She states that she was fully vaccinated.  She has had left-sided chest pain.  This pain does not radiate or move.  The pain is pleuritic in nature and made worse with movement however is present even when she is not coughing or moving.  She states that she feels slightly better when she presses on the left side of her chest.    She reports that over the past few days this chest pain has been worsening and she has had some increased shortness of breath.  She is checking her oxygen at home and states she has not been consistently hypoxic.  She does note that both of her legs bilaterally have felt like they were "getting ready to cramp" however she attributed this to taking a walk over the weekend.  She denies any specific leg swelling.  She reports that shortly prior to arrival she coughed which worsen the pain and was severe enough that it brought tears to her eyes.  She does not have a history of prior DVT/PE, however does take estrogen containing birth control.     HPI     Past Medical History:  Diagnosis Date  . Food intolerance   . GERD (gastroesophageal reflux disease)   . Hepatitis A   . IBS (irritable bowel syndrome)   . Polycystic ovary syndrome     Patient Active Problem List   Diagnosis Date Noted  . GERD (gastroesophageal reflux disease) 10/22/2019  . Anal skin tag 03/04/2019  . IBS (irritable bowel syndrome)   . Abdominal pain, chronic, right upper quadrant 01/29/2019  . Abdominal pain, chronic, right lower quadrant 01/29/2019  . Calculus of gallbladder without cholecystitis without obstruction 12/04/2018  .  Abnormal LFTs 11/05/2018  . Chronic diarrhea 11/05/2018  . Recurrent tonsillitis 05/24/2017    Past Surgical History:  Procedure Laterality Date  . CHOLECYSTECTOMY N/A 01/16/2019   Procedure: LAPAROSCOPIC CHOLECYSTECTOMY;  Surgeon: Lucretia Roers, MD;  Location: [facility];  Service: General;  Laterality: N/A;  . EYE SURGERY Bilateral   . Foot surgery     Plantars wart  . LIVER BIOPSY N/A 01/16/2019   Procedure: LIVER BIOPSY;  Surgeon: Lucretia Roers, MD;  Location: [facility];  Service: General;  Laterality: N/A;  . WISDOM TOOTH EXTRACTION       OB History   No obstetric history on file.     Family History  Problem Relation Age of Onset  . GER disease Mother   . Hyperlipidemia Father   . High Cholesterol Father   . Healthy Sister     Social History   Tobacco Use  . Smoking status: Never Smoker  . Smokeless tobacco: Never Used  Vaping Use  . Vaping Use: Never used  Substance Use Topics  . Alcohol use: Yes    Comment: occasional  . Drug use: No    Home Medications Prior to Admission medications   Medication Sig Start Date End Date Taking? Authorizing Provider  benzonatate (TESSALON) 100 MG capsule Take 1-2 capsules (100-200 mg total) by mouth 3 (three) times daily as needed for cough. 01/30/20   McVey,  Gelene Mink, PA-C  cholestyramine (QUESTRAN) 4 g packet TAKE 1 PACKET (4 G TOTAL) BY MOUTH 2 (TWO) TIMES DAILY. 01/16/20   Harvel Quale, MD  drospirenone-ethinyl estradiol (YASMIN) 3-0.03 MG tablet Take 1 tablet by mouth daily. 09/04/18   [provider]  famotidine (PEPCID) 40 MG tablet Take 1 tablet (40 mg total) by mouth at bedtime. 01/07/20   Rehman, Mechele Dawley, MD  fluticasone (FLONASE) 50 MCG/ACT nasal spray Place 2 sprays into both nostrils daily. 01/30/20   McVey, Gelene Mink, PA-C  hydroxypropyl methylcellulose / hypromellose (ISOPTO TEARS / GONIOVISC) 2.5 % ophthalmic solution Place 1 drop into both eyes 2 (two) times daily.     [provider]  hyoscyamine (LEVBID) 0.375 MG 12 hr tablet Take 1 tablet (0.375 mg total) by mouth daily before breakfast. 11/28/19   Rehman, Mechele Dawley, MD  tretinoin (RETIN-A) 0.025 % cream Apply 1 application topically at bedtime.  09/05/18   [provider]    Allergies    Oxycodone  Review of Systems   Review of Systems  Constitutional: Positive for fatigue. Negative for chills and fever.  HENT: Negative for congestion.   Respiratory: Positive for cough and shortness of breath. Negative for chest tightness.   Cardiovascular: Positive for chest pain. Negative for leg swelling.  Gastrointestinal: Negative for nausea and vomiting.  Musculoskeletal: Negative for back pain and neck pain.  Neurological: Negative for weakness and headaches.  Psychiatric/Behavioral: Negative for confusion.  All other systems reviewed and are negative.   Physical Exam Updated Vital Signs BP 124/87 (BP Location: Left Arm)   Pulse (!) 105   Temp 97.9 F (36.6 C)   Resp 14   Ht 5\' 7"  (1.702 m)   Wt 65.8 kg   SpO2 98%   BMI 22.71 kg/m   Physical Exam Vitals and nursing note reviewed.  Constitutional:      General: She is not in acute distress.    Appearance: She is not ill-appearing.  Eyes:     Conjunctiva/sclera: Conjunctivae normal.  Cardiovascular:     Rate and Rhythm: Regular rhythm. Tachycardia present.     Pulses:          Dorsalis pedis pulses are 2+ on the right side and 2+ on the left side.       Posterior tibial pulses are 2+ on the right side and 2+ on the left side.     Heart sounds: Normal heart sounds. No murmur heard.   Pulmonary:     Effort: Pulmonary effort is normal. No tachypnea, accessory muscle usage or respiratory distress.     Breath sounds: Normal breath sounds. No decreased breath sounds.  Chest:     Chest wall: Tenderness present.  Abdominal:     Tenderness: There is no abdominal tenderness.  Musculoskeletal:     Cervical back: Normal range of  motion and neck supple.     Right lower leg: No tenderness. No edema.     Left lower leg: No tenderness. No edema.  Skin:    General: Skin is warm.  Neurological:     General: No focal deficit present.     Mental Status: She is alert.     Comments: Awake and alert, answers all questions appropriately.  Speech is not slurred.  Psychiatric:        Mood and Affect: Mood normal.        Behavior: Behavior normal.     ED Results / Procedures / Treatments   Labs (  all labs ordered are listed, but only abnormal results are displayed) Labs Reviewed  COMPREHENSIVE METABOLIC PANEL - Abnormal; Notable for the following components:      Result Value   Glucose, Bld 100 (*)    Alkaline Phosphatase 34 (*)    All other components within normal limits  I-STAT BETA HCG BLOOD, ED ([facility], [facility], [facility] ONLY) - Normal  LIPASE, BLOOD  CBC WITH DIFFERENTIAL/PLATELET  TROPONIN I (HIGH SENSITIVITY)    EKG EKG Interpretation  Date/Time:  Tuesday February 11 2020 18:36:38 EST Ventricular Rate:  94 PR Interval:    QRS Duration: 84 QT Interval:  359 QTC Calculation: 449 R Axis:   47 Text Interpretation: Sinus rhythm Borderline short PR interval LAE, consider biatrial enlargement Probable LVH with secondary repol abnrm No old tracing to compare Confirmed by Aletta Edouard [phone]) on 02/11/2020 6:40:27 PM   Radiology DG Chest 2 View  Result Date: 02/11/2020 CLINICAL DATA:  Left-sided chest pain. EXAM: CHEST - 2 VIEW COMPARISON:  None. FINDINGS: There is no apparent cluster of nodules projecting over the left lower lung zone. There is no focal infiltrate or large pleural effusion. No pneumothorax. The heart size is normal. There is no acute osseous abnormality. IMPRESSION: No active cardiopulmonary disease. Electronically Signed   By: Constance Holster M.D.   On: 02/11/2020 17:52   CT Angio Chest PE W/Cm &/Or Wo Cm  Result Date: 02/11/2020 CLINICAL DATA:  Shortness of breath and left-sided chest pain. EXAM: CT  ANGIOGRAPHY CHEST WITH CONTRAST TECHNIQUE: Multidetector CT imaging of the chest was performed using the standard protocol during bolus administration of intravenous contrast. Multiplanar CT image reconstructions and MIPs were obtained to evaluate the vascular anatomy. CONTRAST:  144mL OMNIPAQUE IOHEXOL 350 MG/ML SOLN COMPARISON:  None. FINDINGS: Cardiovascular: Satisfactory opacification of the pulmonary arteries to the segmental level. No evidence of pulmonary embolism. Normal heart size. No pericardial effusion. Mediastinum/Nodes: No enlarged mediastinal, hilar, or axillary lymph nodes. Thyroid gland, trachea, and esophagus demonstrate no significant findings. Lungs/Pleura: Lungs are clear. No pleural effusion or pneumothorax. Upper Abdomen: No acute abnormality. Musculoskeletal: No chest wall abnormality. No acute or significant osseous findings. Review of the MIP images confirms the above findings. IMPRESSION: Negative examination for pulmonary embolism or acute cardiopulmonary disease. Electronically Signed   By: Virgina Norfolk M.D.   On: 02/11/2020 19:57    Procedures Procedures (including critical care time)  Medications Ordered in ED Medications  lactated ringers bolus 1,000 mL (0 mLs Intravenous Stopped 02/11/20 2000)  iohexol (OMNIPAQUE) 350 MG/ML injection 100 mL (100 mLs Intravenous Contrast Given 02/11/20 1941)    ED Course  I have reviewed the triage vital signs and the nursing notes.  Pertinent labs & imaging results that were available during my care of the patient were reviewed by me and considered in my medical decision making (see chart for details).    MDM Rules/Calculators/A&P                         Patient is a 25 year old woman who presents today for evaluation of pleuritic chest pain, tachycardia, and leg soreness.  She is recently post Covid and additionally takes estrogen-containing birth control. Given her multiple risk factors for PE with ongoing chest pain from  Covid and worsening today high suspicion for PE.  Labs are obtained and reviewed, CBC, CMP, lipase and troponin are all unremarkable.  Pregnancy test is negative.  Here she is initially tachycardic with heart rates into  the 130s.  This remained in the high 90s to low 100s while in the emergency room however she was not hypoxic and was 98 to 100% on room air.  X-ray without pneumothorax, consolidation, or other acute abnormality.  Given recent COVID-19 infection I suspect patient's D-dimer, if obtained, would have most likely been positive therefore d-dimer was skipped.  Additionally CT scan would have shown occult pneumonia or pneumonitis that may have been missed on CXR.  This was discussed with patient who agreed with CT scan.  CT scan was reassuring with out PE, consolidation, pneumothorax, pneumonitis or other cause of patient's symptoms.  Patient's EKG was slightly abnormal however not consistent with ACS/ischemia, pulmonary disease/stress pattern or other significant acute cause.  Patiently there is no prior EKG for comparison.  Recommended the patient follow-up with her primary care doctor, however this does not appear to be causing her symptoms. Suspect that patient has musculoskeletal chest pain/costochondritis.  Recommended NSAIDs and prophylactically taking Prilosec to help decrease the risk of NSAID related stomach irritation or ulcers.  Given duration of pain delta troponin is not indicated.  Return precautions were discussed with patient who states their understanding.  At the time of discharge patient denied any unaddressed complaints or concerns.  Patient is agreeable for discharge home.  Note: Portions of this report may have been transcribed using voice recognition software. Every effort was made to ensure accuracy; however, inadvertent computerized transcription errors may be present.   CHRYSANTHE DEVANE was evaluated in Emergency Department on 02/11/2020 for the symptoms described in  the history of present illness. She was evaluated in the context of the global COVID-19 pandemic, which necessitated consideration that the patient might be at risk for infection with the SARS-CoV-2 virus that causes COVID-19. Institutional protocols and algorithms that pertain to the evaluation of patients at risk for COVID-19 are in a state of rapid change based on information released by regulatory bodies including the CDC and federal and state organizations. These policies and algorithms were followed during the patient's care in the ED.   Final Clinical Impression(s) / ED Diagnoses Final diagnoses:  Atypical chest pain  Nonspecific abnormal electrocardiogram (ECG) (EKG)  Acute costochondritis  History of COVID-19    Rx / DC Orders ED Discharge Orders    None       Ollen Gross 02/11/20 2129    Hayden Rasmussen, MD 02/12/20 1045

## 2020-02-12 ENCOUNTER — Ambulatory Visit: Payer: Self-pay

## 2020-03-03 ENCOUNTER — Ambulatory Visit (INDEPENDENT_AMBULATORY_CARE_PROVIDER_SITE_OTHER): Payer: 59 | Admitting: Internal Medicine

## 2020-03-12 ENCOUNTER — Other Ambulatory Visit: Payer: Self-pay

## 2020-03-12 ENCOUNTER — Encounter: Payer: Self-pay | Admitting: Family Medicine

## 2020-03-12 ENCOUNTER — Ambulatory Visit (INDEPENDENT_AMBULATORY_CARE_PROVIDER_SITE_OTHER): Payer: 59 | Admitting: Family Medicine

## 2020-03-12 VITALS — BP 137/82 | HR 86 | Temp 98.1°F | Ht 67.0 in | Wt 147.0 lb

## 2020-03-12 DIAGNOSIS — R0989 Other specified symptoms and signs involving the circulatory and respiratory systems: Secondary | ICD-10-CM

## 2020-03-12 DIAGNOSIS — R198 Other specified symptoms and signs involving the digestive system and abdomen: Secondary | ICD-10-CM

## 2020-03-12 DIAGNOSIS — Z0000 Encounter for general adult medical examination without abnormal findings: Secondary | ICD-10-CM

## 2020-03-12 DIAGNOSIS — R1319 Other dysphagia: Secondary | ICD-10-CM

## 2020-03-12 DIAGNOSIS — K580 Irritable bowel syndrome with diarrhea: Secondary | ICD-10-CM

## 2020-03-12 NOTE — Progress Notes (Signed)
Brianna Bridges is a 25 y.o. female presents to office today for annual physical exam examination.    Concerns today include: 1. None. Doing well  Occupation: [employer], Marital status: has significant other, Substance use: none Diet: fair, Exercise: no structured currently Last eye exam: >1 year Last dental exam: UTD Last colonoscopy: sees Dr Laural Golden Last mammogram: n/a Last pap smear: UTD sees Dr Lynnette Caffey Refills needed today: n/a Immunizations needed:  Immunization History  Administered Date(s) Administered  . DTaP 04/15/1996, 06/17/1996, 08/19/1996, 05/14/1997, 04/12/2001  . HPV Quadrivalent 05/23/2011, 01/17/2012, 05/15/2012  . Hepatitis A 05/23/2011, 01/17/2012  . Hepatitis A, Adult 05/23/2011, 01/17/2012  . Hepatitis B February 05, 1997, 03/18/1996, 08/19/1996  . Hepatitis B, adult 09/18/2015  . Hepatitis B, ped/adol August 05, 1996, 03/18/1996, 08/19/1996, 01/02/2015, 04/14/2015  . HiB (PRP-OMP) 04/15/1996, 06/17/1996, 08/19/1996, 02/21/1997  . Hpv 05/23/2011, 01/17/2012, 05/15/2012  . IPV 04/15/1996, 06/17/1996, 08/19/1996, 04/12/2001  . Influenza,inj,Quad PF,6+ Mos 12/08/2017, 12/04/2019  . MMR 05/14/1997, 04/12/2001  . Meningococcal Conjugate 04/17/2012  . PPD Test 06/15/2015, 06/27/2016, 09/18/2017  . Td 09/11/2007, 09/18/2017  . Tdap 09/11/2007, 09/18/2017     Past Medical History:  Diagnosis Date  . Food intolerance   . GERD (gastroesophageal reflux disease)   . Hepatitis A   . IBS (irritable bowel syndrome)   . Polycystic ovary syndrome    Social History   Socioeconomic History  . Marital status: Single    Spouse name: Not on file  . Number of children: Not on file  . Years of education: Not on file  . Highest education level: Not on file  Occupational History  . Not on file  Tobacco Use  . Smoking status: Never Smoker  . Smokeless tobacco: Never Used  Vaping Use  . Vaping Use: Never used  Substance and Sexual Activity  . Alcohol use: Yes    Comment:  occasional  . Drug use: No  . Sexual activity: Not on file  Other Topics Concern  . Not on file  Social History Narrative  . Not on file   Social Determinants of Health   Financial Resource Strain: Not on file  Food Insecurity: Not on file  Transportation Needs: Not on file  Physical Activity: Not on file  Stress: Not on file  Social Connections: Not on file  Intimate Partner Violence: Not on file   Past Surgical History:  Procedure Laterality Date  . CHOLECYSTECTOMY N/A 01/16/2019   Procedure: LAPAROSCOPIC CHOLECYSTECTOMY;  Surgeon: [name], MD;  Location: [facility];  Service: General;  Laterality: N/A;  . EYE SURGERY Bilateral   . Foot surgery     Plantars wart  . LIVER BIOPSY N/A 01/16/2019   Procedure: LIVER BIOPSY;  Surgeon: [name], MD;  Location: [facility];  Service: General;  Laterality: N/A;  . WISDOM TOOTH EXTRACTION     Family History  Problem Relation Age of Onset  . GER disease Mother   . Hyperlipidemia Father   . High Cholesterol Father   . Healthy Sister     Current Outpatient Medications:  .  cholestyramine (QUESTRAN) 4 g packet, TAKE 1 PACKET (4 G TOTAL) BY MOUTH 2 (TWO) TIMES DAILY., Disp: 180 packet, Rfl: 3 .  drospirenone-ethinyl estradiol (YASMIN) 3-0.03 MG tablet, Take 1 tablet by mouth daily., Disp: , Rfl:  .  famotidine (PEPCID) 40 MG tablet, Take 1 tablet (40 mg total) by mouth at bedtime., Disp: 90 tablet, Rfl: 1 .  fluticasone (FLONASE) 50 MCG/ACT nasal spray, Place 2 sprays  into both nostrils daily., Disp: 16 g, Rfl: 6 .  hydroxypropyl methylcellulose / hypromellose (ISOPTO TEARS / GONIOVISC) 2.5 % ophthalmic solution, Place 1 drop into both eyes 2 (two) times daily., Disp: , Rfl:  .  hyoscyamine (LEVBID) 0.375 MG 12 hr tablet, Take 1 tablet (0.375 mg total) by mouth daily before breakfast., Disp: 30 tablet, Rfl: 3 .  tretinoin (RETIN-A) 0.025 % cream, Apply 1 application topically at bedtime. , Disp: , Rfl:   Allergies   Allergen Reactions  . Oxycodone Itching     ROS: Review of Systems A comprehensive review of systems was negative except for: Respiratory: positive for sensation of throat congestion Gastrointestinal: positive for diarrhea and dysphagia    Physical exam BP 137/82   Pulse 86   Temp 98.1 F (36.7 C) (Temporal)   Ht 5' 7"  (1.702 m)   Wt 147 lb (66.7 kg)   SpO2 98%   BMI 23.02 kg/m  General appearance: alert, cooperative, appears stated age and no distress Head: Normocephalic, without obvious abnormality, atraumatic Eyes: negative findings: lids and lashes normal, conjunctivae and sclerae normal, corneas clear and pupils equal, round, reactive to light and accomodation Ears: normal TM's and external ear canals both ears Nose: Nares normal. Septum midline. Mucosa normal. No drainage or sinus tenderness. Throat: Oropharynx clear but some cobblestoning noted Neck: no adenopathy, supple, symmetrical, trachea midline and thyroid not enlarged, symmetric, no tenderness/mass/nodules Back: symmetric, no curvature. ROM normal. No CVA tenderness. Lungs: clear to auscultation bilaterally Heart: regular rate and rhythm, S1, S2 normal, no murmur, click, rub or gallop Abdomen: soft, non-tender; bowel sounds normal; no masses,  no organomegaly Extremities: extremities normal, atraumatic, no cyanosis or edema Pulses: 2+ and symmetric Skin: Skin color, texture, turgor normal. No rashes or lesions Lymph nodes: Cervical, supraclavicular, and axillary nodes normal. Neurologic: Alert and oriented X 3, normal strength and tone. Normal symmetric reflexes. Normal coordination and gait Psych: Mood stable, speech normal, affect appropriate Depression screen [facility] 2/9 03/12/2020 09/21/2016 06/08/2016  Decreased Interest 0 0 0  Down, Depressed, Hopeless 0 0 0  PHQ - 2 Score 0 0 0  Altered sleeping 0 - -  Tired, decreased energy 0 - -  Change in appetite 0 - -  Feeling bad or failure about yourself  0 - -   Trouble concentrating 0 - -  Moving slowly or fidgety/restless 0 - -  Suicidal thoughts 0 - -  PHQ-9 Score 0 - -    Assessment/ Plan: Brianna Bridges here for annual physical exam.   Annual physical exam  Irritable bowel syndrome with diarrhea  Esophageal dysphagia  Globus sensation  Overall healthy physical exam.  Advised to incorporate a more balanced diet and physical exercise  She will continue following with her gastroenterologist for IBS.  I advised her to consider increasing water intake to relieve what she feels to be as throat congestion.  This apparently has been present since she had COVID-19 2 months ago.  Unsure if this is truly related but do not think that allergy medicine and Mucinex have much of a role in her symptoms.  If ongoing symptoms, could consider PPI for 2 weeks.  And if symptoms again are refractory to these therapies advised to follow-up with Dr. Laural Golden as she has had history of globus sensation and esophageal dysphagia in the past where an EGD was recommended.  She was good understanding of the plan and will follow up on a yearly basis or as needed for acute  issues  Counseled on healthy lifestyle choices, including diet (rich in fruits, vegetables and lean meats and low in salt and simple carbohydrates) and exercise (at least 30 minutes of moderate physical activity daily).  Patient to follow up in 1 year for annual exam or sooner if needed.  [place] M. Lajuana Ripple, DO

## 2020-03-12 NOTE — Patient Instructions (Signed)
Increase water consumption. You should consider seeing Dr Laural Golden for an EGD, sometimes esophageal narrowing/ strictures can present with the symptoms you are experiencing. May consider trial of Omeprazole 32m daily for 2 weeks if above does not resolve symptoms.   Preventive Care [age] Old, Female Preventive care refers to lifestyle choices and visits with your health care provider that can promote health and wellness. This includes:  A yearly physical exam. This is also called an annual wellness visit.  Regular dental and eye exams.  Immunizations.  Screening for certain conditions.  Healthy lifestyle choices, such as: ? Eating a healthy diet. ? Getting regular exercise. ? Not using drugs or products that contain nicotine and tobacco. ? Limiting alcohol use. What can I expect for my preventive care visit? Physical exam Your health care provider may check your:  Height and weight. These may be used to calculate your BMI (body mass index). BMI is a measurement that tells if you are at a healthy weight.  Heart rate and blood pressure.  Body temperature.  Skin for abnormal spots. Counseling Your health care provider may ask you questions about your:  Past medical problems.  Family's medical history.  Alcohol, tobacco, and drug use.  Emotional well-being.  Home life and relationship well-being.  Sexual activity.  Diet, exercise, and sleep habits.  Work and work eStatistician  Access to firearms.  Method of birth control.  Menstrual cycle.  Pregnancy history. What immunizations do I need? Vaccines are usually given at various ages, according to a schedule. Your health care provider will recommend vaccines for you based on your age, medical history, and lifestyle or other factors, such as travel or where you work.   What tests do I need? Blood tests  Lipid and cholesterol levels. These may be checked every 5 years starting at age 24  Hepatitis C  test.  Hepatitis B test. Screening  Diabetes screening. This is done by checking your blood sugar (glucose) after you have not eaten for a while (fasting).  STD (sexually transmitted disease) testing, if you are at risk.  BRCA-related cancer screening. This may be done if you have a family history of breast, ovarian, tubal, or peritoneal cancers.  Pelvic exam and Pap test. This may be done every 3 years starting at age 24 Starting at age 24 this may be done every 5 years if you have a Pap test in combination with an HPV test. Talk with your health care provider about your test results, treatment options, and if necessary, the need for more tests.   Follow these instructions at home: Eating and drinking  Eat a healthy diet that includes fresh fruits and vegetables, whole grains, lean protein, and low-fat dairy products.  Take vitamin and mineral supplements as recommended by your health care provider.  Do not drink alcohol if: ? Your health care provider tells you not to drink. ? You are pregnant, may be pregnant, or are planning to become pregnant.  If you drink alcohol: ? Limit how much you have to 0-1 drink a day. ? Be aware of how much alcohol is in your drink. In the [place] one drink equals one 12 oz bottle of beer (355 mL), one 5 oz glass of wine (148 mL), or one 1 oz glass of hard liquor (44 mL).   Lifestyle  Take daily care of your teeth and gums. Brush your teeth every morning and night with fluoride toothpaste. Floss one time each day.  Stay active. Exercise  for at least 30 minutes 5 or more days each week.  Do not use any products that contain nicotine or tobacco, such as cigarettes, e-cigarettes, and chewing tobacco. If you need help quitting, ask your health care provider.  Do not use drugs.  If you are sexually active, practice safe sex. Use a condom or other form of protection to prevent STIs (sexually transmitted infections).  If you do not wish to become  pregnant, use a form of birth control. If you plan to become pregnant, see your health care provider for a prepregnancy visit.  Find healthy ways to cope with stress, such as: ? Meditation, yoga, or listening to music. ? Journaling. ? Talking to a trusted person. ? Spending time with friends and family. Safety  Always wear your seat belt while driving or riding in a vehicle.  Do not drive: ? If you have been drinking alcohol. Do not ride with someone who has been drinking. ? When you are tired or distracted. ? While texting.  Wear a helmet and other protective equipment during sports activities.  If you have firearms in your house, make sure you follow all gun safety procedures.  Seek help if you have been physically or sexually abused. What's next?  Go to your health care provider once a year for an annual wellness visit.  Ask your health care provider how often you should have your eyes and teeth checked.  Stay up to date on all vaccines. This information is not intended to replace advice given to you by your health care provider. Make sure you discuss any questions you have with your health care provider. Document Revised: 09/22/2019 Document Reviewed: 10/05/2017 Elsevier Patient Education  2021 [employer].

## 2020-03-24 ENCOUNTER — Other Ambulatory Visit (INDEPENDENT_AMBULATORY_CARE_PROVIDER_SITE_OTHER): Payer: Self-pay | Admitting: Internal Medicine

## 2020-03-24 NOTE — Telephone Encounter (Signed)
Request placed on Dr. Olevia Perches desk.

## 2020-03-25 ENCOUNTER — Other Ambulatory Visit (INDEPENDENT_AMBULATORY_CARE_PROVIDER_SITE_OTHER): Payer: Self-pay | Admitting: Internal Medicine

## 2020-03-25 MED ORDER — HYOSCYAMINE SULFATE ER 0.375 MG PO TB12: 0.3750 mg | ORAL_TABLET | Freq: Every day | ORAL | 1 refills | Status: DC

## 2020-03-25 NOTE — Progress Notes (Signed)
Prescription for Levbid 0.375 mg 1 daily sent to patient's pharmacy 90 doses 1 refill

## 2020-03-28 ENCOUNTER — Encounter (INDEPENDENT_AMBULATORY_CARE_PROVIDER_SITE_OTHER): Payer: Self-pay

## 2020-04-10 ENCOUNTER — Encounter (INDEPENDENT_AMBULATORY_CARE_PROVIDER_SITE_OTHER): Payer: Self-pay | Admitting: *Deleted

## 2020-04-23 ENCOUNTER — Encounter (INDEPENDENT_AMBULATORY_CARE_PROVIDER_SITE_OTHER): Payer: Self-pay

## 2020-04-23 ENCOUNTER — Ambulatory Visit (INDEPENDENT_AMBULATORY_CARE_PROVIDER_SITE_OTHER): Payer: 59 | Admitting: Internal Medicine

## 2020-04-23 ENCOUNTER — Other Ambulatory Visit (INDEPENDENT_AMBULATORY_CARE_PROVIDER_SITE_OTHER): Payer: Self-pay

## 2020-04-23 ENCOUNTER — Telehealth (INDEPENDENT_AMBULATORY_CARE_PROVIDER_SITE_OTHER): Payer: Self-pay

## 2020-04-23 ENCOUNTER — Other Ambulatory Visit: Payer: Self-pay

## 2020-04-23 ENCOUNTER — Encounter (INDEPENDENT_AMBULATORY_CARE_PROVIDER_SITE_OTHER): Payer: Self-pay | Admitting: Internal Medicine

## 2020-04-23 VITALS — BP 111/77 | HR 111 | Temp 98.8°F | Ht 67.0 in | Wt 152.6 lb

## 2020-04-23 DIAGNOSIS — K529 Noninfective gastroenteritis and colitis, unspecified: Secondary | ICD-10-CM

## 2020-04-23 DIAGNOSIS — K219 Gastro-esophageal reflux disease without esophagitis: Secondary | ICD-10-CM

## 2020-04-23 DIAGNOSIS — Z1211 Encounter for screening for malignant neoplasm of colon: Secondary | ICD-10-CM

## 2020-04-23 DIAGNOSIS — K625 Hemorrhage of anus and rectum: Secondary | ICD-10-CM

## 2020-04-23 HISTORY — DX: Hemorrhage of anus and rectum: K62.5

## 2020-04-23 MED ORDER — PANTOPRAZOLE SODIUM 40 MG PO TBEC: 40.0000 mg | DELAYED_RELEASE_TABLET | Freq: Every day | ORAL | 3 refills | Status: DC

## 2020-04-23 MED ORDER — NA SULFATE-K SULFATE-MG SULF 17.5-3.13-1.6 GM/177ML PO SOLN: 1.0000 | Freq: Once | ORAL | 0 refills | Status: DC

## 2020-04-23 NOTE — Telephone Encounter (Signed)
Brianna Bridges, CMA  

## 2020-04-23 NOTE — Progress Notes (Signed)
Presenting complaint;  Follow-up for GERD and diarrhea felt to be due to IBS.  Database and subjective:  Patient is 25 year old Caucasian female who has chronic diarrhea of felt to be due to IBS as well as a history of GERD who is here for scheduled visit.  Her last visit was telephone visit in November 2021. She is not feeling well.  She was doing well until about 2 to 3 weeks ago and since then she has been having regurgitation during the daytime she also feels as if food is getting stuck in her upper esophagus and she has to wash it down with water.  She has not had an episode of food impaction.  She is not having heartburn though.  She says she was doing well while on Levbid.  She was having 1-2 bowel movements per day.  However she has noted relapse of her diarrhea in the last 3 weeks or so.  She is having 3-4 stools per day.  Stools are loose and yellow in color and she is also passing mucus.  On 2 different Saturdays she passed blood with her bowel movement.  She documented on her smart phone which she shared with me.  On one of the Saturday she passed blood 4 times.  Review of the pictures revealed small clots and other picture shows burgundy blood mixed with stool.  She continues to experience lower abdominal cramps. She tested positive for Covid in December 2021.  She was seen in emergency room for shortness of breath and chest pain on 02/11/2020.  CTA chest was negative for pulmonary embolism or other abnormalities.  She also had lab studies which are unremarkable. She denies nocturnal bowel movements.  She states she takes Levbid usually half a tablet 1 hour before her scheduled wake-up time.  She says she takes the medicine is able to go back to sleep.  She is not having any side effects.  Her appetite is about the same.  She has gained 6 pounds since her last visit. She denies nausea vomiting fever or chills.  Current Medications: Outpatient Encounter Medications as of 04/23/2020  Medication  Sig  . drospirenone-ethinyl estradiol (YASMIN) 3-0.03 MG tablet Take 1 tablet by mouth daily.  . famotidine (PEPCID) 40 MG tablet Take 1 tablet (40 mg total) by mouth at bedtime.  . hydroxypropyl methylcellulose / hypromellose (ISOPTO TEARS / GONIOVISC) 2.5 % ophthalmic solution Place 1 drop into both eyes 2 (two) times daily.  . hyoscyamine (LEVBID) 0.375 MG 12 hr tablet Take 1 tablet (0.375 mg total) by mouth daily before breakfast.  . ketoconazole (NIZORAL) 2 % shampoo SMARTSIG:5 Milliliter(s) Topical 2-3 Times Weekly  . metroNIDAZOLE (METROCREAM) 0.75 % cream SMARTSIG:Sparingly Topical Twice Daily  . tretinoin (RETIN-A) 0.025 % cream Apply 1 application topically at bedtime.    No facility-administered encounter medications on file as of 04/23/2020.    Objective: Blood pressure 111/77, pulse (!) 111, temperature 98.8 F (37.1 C), temperature source Oral, height 5' 7" (1.702 m), weight 152 lb 9.6 oz (69.2 kg), last menstrual period 04/07/2020. Patient is alert and in no acute distress.  She is wearing a facial mask. She has maculopapular rash over her face and forehead(Acne). Conjunctiva is pink. Sclera is nonicteric Oropharyngeal mucosa is normal. No neck masses or thyromegaly noted. Cardiac exam with regular rhythm normal S1 and S2. No murmur or gallop noted. Lungs are clear to auscultation. Abdomen is symmetrical.  She has laparoscopy scars across upper abdomen and periumbilical region.  Bowel  sounds are normal.  On palpation abdomen is soft.  She has mild tenderness in right upper quadrant.  No organomegaly or masses. No LE edema or clubbing noted.  Labs/studies Results:  CBC Latest Ref Rng & Units 02/11/2020 10/24/2019 01/29/2019  WBC 4.0 - 10.5 K/uL 8.6 5.2 6.4  Hemoglobin 12.0 - 15.0 g/dL 14.6 14.1 14.3  Hematocrit 36.0 - 46.0 % 44.1 43.4 43.6  Platelets 150 - 400 K/uL 375 264 281    CMP Latest Ref Rng & Units 02/11/2020 05/07/2019 01/29/2019  Glucose 70 - 99 mg/dL 100(H) - 95   BUN 6 - 20 mg/dL 13 - 13  Creatinine 0.44 - 1.00 mg/dL 0.83 - 0.79  Sodium 135 - 145 mmol/L 140 - 138  Potassium 3.5 - 5.1 mmol/L 3.9 - 4.4  Chloride 98 - 111 mmol/L 104 - 102  CO2 22 - 32 mmol/L 25 - 23  Calcium 8.9 - 10.3 mg/dL 9.5 - 9.5  Total Protein 6.5 - 8.1 g/dL 7.6 6.7 7.8  Total Bilirubin 0.3 - 1.2 mg/dL 0.7 <0.2 0.6  Alkaline Phos 38 - 126 U/L 34(L) 35(L) 32(L)  AST 15 - 41 U/L _0 ALT 0 - 44 U/L _1 Hepatic Function Latest Ref Rng & Units 02/11/2020 05/07/2019 01/29/2019  Total Protein 6.5 - 8.1 g/dL 7.6 6.7 7.8  Albumin 3.5 - 5.0 g/dL 4.1 4.5 4.3  AST 15 - 41 U/L _2 ALT 0 - 44 U/L _3 Alk Phosphatase 38 - 126 U/L 34(L) 35(L) 32(L)  Total Bilirubin 0.3 - 1.2 mg/dL 0.7 <0.2 0.6  Bilirubin, Direct 0.00 - 0.40 mg/dL - 0.08 -    Lab Results  Component Value Date   CRP 0.6 10/24/2019       GI pathogen panel in September 2021 was positive for enteropathogenic E. coli.      sed rate was 2 in September 2021.     Celiac antibody panel negative in March 2021.  Lab data from 02/11/2020 reviewed.   Assessment:  #1.  Chronic diarrhea.  She is felt to have diarrhea due to IBS.  This is a first time she has passed blood per rectum.  She continues to have flareup of her symptoms every few weeks a few months.  Now that she has noted blood with her bowel movements need to look further and make sure she does not have inflammatory bowel disease.  #2.  GERD.  Symptoms are not well controlled with dietary measures and famotidine at bedtime.  I wonder if COVID has anything to do with the symptoms.  She tested positive for Covid in December 2021.  She is also having difficulty swallowing.   Plan:  Discontinue famotidine. Begin pantoprazole 40 mg by mouth 30 minutes before breakfast daily. Can take famotidine OTC 20 mg nightly as needed. Continue Levbid. Diagnostic esophagogastroduodenoscopy with possible esophageal dilation and diagnostic colonoscopy in near  future. Office visit in 6 months.

## 2020-04-23 NOTE — Patient Instructions (Signed)
Take pantoprazole by mouth 30 minutes before breakfast. Can take Pepcid 20 mg at bedtime as needed for breakthrough heartburn. Esophagogastroduodenoscopy and colonoscopy to be scheduled in near future.

## 2020-04-24 ENCOUNTER — Other Ambulatory Visit (INDEPENDENT_AMBULATORY_CARE_PROVIDER_SITE_OTHER): Payer: Self-pay

## 2020-04-28 ENCOUNTER — Telehealth (INDEPENDENT_AMBULATORY_CARE_PROVIDER_SITE_OTHER): Payer: Self-pay

## 2020-04-28 DIAGNOSIS — Z1211 Encounter for screening for malignant neoplasm of colon: Secondary | ICD-10-CM

## 2020-04-28 MED ORDER — PEG 3350-KCL-NA BICARB-NACL 420 G PO SOLR: 4000.0000 mL | ORAL | 0 refills | Status: DC

## 2020-04-28 NOTE — Telephone Encounter (Signed)
Brianna Bridges, CMA  

## 2020-04-30 NOTE — Patient Instructions (Signed)
Brianna Bridges  04/30/2020     @PREFPERIOPPHARMACY @   Your procedure is scheduled on  05/06/2020.   Report to [facility] at  0830  A.M.   Call this number if you have problems the morning of surgery:  [phone]   Remember:  Follow the diet and prep instructions given to you by the office.                     Take these medicines the morning of surgery with A SIP OF WATER  Levbid, protonix.    Please brush your teeth.  Do not wear jewelry, make-up or nail polish.  Do not wear lotions, powders, or perfumes, or deodorant.  Do not shave 48 hours prior to surgery.  Men may shave face and neck.  Do not bring valuables to the hospital.  [facility] is not responsible for any belongings or valuables.  Contacts, dentures or bridgework may not be worn into surgery.  Leave your suitcase in the car.  After surgery it may be brought to your room.  For patients admitted to the hospital, discharge time will be determined by your treatment team.  Patients discharged the day of surgery will not be allowed to drive home and must have someone with them for 24 hours.   Special instructions:  DO NOT smoke tobacco or vape the morning of your procedure.   Please read over the following fact sheets that you were given. Anesthesia Post-op Instructions and Care and Recovery After Surgery       Upper Endoscopy, Adult, Care After This sheet gives you information about how to care for yourself after your procedure. Your health care provider may also give you more specific instructions. If you have problems or questions, contact your health care provider. What can I expect after the procedure? After the procedure, it is common to have:  A sore throat.  Mild stomach pain or discomfort.  Bloating.  Nausea. Follow these instructions at home:  Follow instructions from your health care provider about what to eat or drink after your procedure.  Return to your normal  activities as told by your health care provider. Ask your health care provider what activities are safe for you.  Take over-the-counter and prescription medicines only as told by your health care provider.  If you were given a sedative during the procedure, it can affect you for several hours. Do not drive or operate machinery until your health care provider says that it is safe.  Keep all follow-up visits as told by your health care provider. This is important.   Contact a health care provider if you have:  A sore throat that lasts longer than one day.  Trouble swallowing. Get help right away if:  You vomit blood or your vomit looks like coffee grounds.  You have: ? A fever. ? Bloody, black, or tarry stools. ? A severe sore throat or you cannot swallow. ? Difficulty breathing. ? Severe pain in your chest or abdomen. Summary  After the procedure, it is common to have a sore throat, mild stomach discomfort, bloating, and nausea.  If you were given a sedative during the procedure, it can affect you for several hours. Do not drive or operate machinery until your health care provider says that it is safe.  Follow instructions from your health care provider about what to eat or drink after your procedure.  Return to your normal  activities as told by your health care provider. This information is not intended to replace advice given to you by your health care provider. Make sure you discuss any questions you have with your health care provider. Document Revised: 01/22/2019 Document Reviewed: 06/26/2017 Elsevier Patient Education  2021 [employer].  Colonoscopy, Adult, Care After This sheet gives you information about how to care for yourself after your procedure. Your health care provider may also give you more specific instructions. If you have problems or questions, contact your health care provider. What can I expect after the procedure? After the procedure, it is common to  have:  A small amount of blood in your stool for 24 hours after the procedure.  Some gas.  Mild cramping or bloating of your abdomen. Follow these instructions at home: Eating and drinking  Drink enough fluid to keep your urine pale yellow.  Follow instructions from your health care provider about eating or drinking restrictions.  Resume your normal diet as instructed by your health care provider. Avoid heavy or fried foods that are hard to digest.   Activity  Rest as told by your health care provider.  Avoid sitting for a long time without moving. Get up to take short walks every 1-2 hours. This is important to improve blood flow and breathing. Ask for help if you feel weak or unsteady.  Return to your normal activities as told by your health care provider. Ask your health care provider what activities are safe for you. Managing cramping and bloating  Try walking around when you have cramps or feel bloated.  Apply heat to your abdomen as told by your health care provider. Use the heat source that your health care provider recommends, such as a moist heat pack or a heating pad. ? Place a towel between your skin and the heat source. ? Leave the heat on for 20-30 minutes. ? Remove the heat if your skin turns bright red. This is especially important if you are unable to feel pain, heat, or cold. You may have a greater risk of getting burned.   General instructions  If you were given a sedative during the procedure, it can affect you for several hours. Do not drive or operate machinery until your health care provider says that it is safe.  For the first 24 hours after the procedure: ? Do not sign important documents. ? Do not drink alcohol. ? Do your regular daily activities at a slower pace than normal. ? Eat soft foods that are easy to digest.  Take over-the-counter and prescription medicines only as told by your health care provider.  Keep all follow-up visits as told by your  health care provider. This is important. Contact a health care provider if:  You have blood in your stool 2-3 days after the procedure. Get help right away if you have:  More than a small spotting of blood in your stool.  Large blood clots in your stool.  Swelling of your abdomen.  Nausea or vomiting.  A fever.  Increasing pain in your abdomen that is not relieved with medicine. Summary  After the procedure, it is common to have a small amount of blood in your stool. You may also have mild cramping and bloating of your abdomen.  If you were given a sedative during the procedure, it can affect you for several hours. Do not drive or operate machinery until your health care provider says that it is safe.  Get help right away  if you have a lot of blood in your stool, nausea or vomiting, a fever, or increased pain in your abdomen. This information is not intended to replace advice given to you by your health care provider. Make sure you discuss any questions you have with your health care provider. Document Revised: 01/18/2019 Document Reviewed: 08/20/2018 Elsevier Patient Education  2021 [place] After This sheet gives you information about how to care for yourself after your procedure. Your health care provider may also give you more specific instructions. If you have problems or questions, contact your health care provider. What can I expect after the procedure? After the procedure, it is common to have:  Tiredness.  Forgetfulness about what happened after the procedure.  Impaired judgment for important decisions.  Nausea or vomiting.  Some difficulty with balance. Follow these instructions at home: For the time period you were told by your health care provider:  Rest as needed.  Do not participate in activities where you could fall or become injured.  Do not drive or use machinery.  Do not drink alcohol.  Do not take sleeping  pills or medicines that cause drowsiness.  Do not make important decisions or sign legal documents.  Do not take care of children on your own.      Eating and drinking  Follow the diet that is recommended by your health care provider.  Drink enough fluid to keep your urine pale yellow.  If you vomit: ? Drink water, juice, or soup when you can drink without vomiting. ? Make sure you have little or no nausea before eating solid foods. General instructions  Have a responsible adult stay with you for the time you are told. It is important to have someone help care for you until you are awake and alert.  Take over-the-counter and prescription medicines only as told by your health care provider.  If you have sleep apnea, surgery and certain medicines can increase your risk for breathing problems. Follow instructions from your health care provider about wearing your sleep device: ? Anytime you are sleeping, including during daytime naps. ? While taking prescription pain medicines, sleeping medicines, or medicines that make you drowsy.  Avoid smoking.  Keep all follow-up visits as told by your health care provider. This is important. Contact a health care provider if:  You keep feeling nauseous or you keep vomiting.  You feel light-headed.  You are still sleepy or having trouble with balance after 24 hours.  You develop a rash.  You have a fever.  You have redness or swelling around the IV site. Get help right away if:  You have trouble breathing.  You have new-onset confusion at home. Summary  For several hours after your procedure, you may feel tired. You may also be forgetful and have poor judgment.  Have a responsible adult stay with you for the time you are told. It is important to have someone help care for you until you are awake and alert.  Rest as told. Do not drive or operate machinery. Do not drink alcohol or take sleeping pills.  Get help right away if you  have trouble breathing, or if you suddenly become confused. This information is not intended to replace advice given to you by your health care provider. Make sure you discuss any questions you have with your health care provider. Document Revised: 10/10/2019 Document Reviewed: 12/27/2018 Elsevier Patient Education  2021 [employer].

## 2020-05-04 ENCOUNTER — Other Ambulatory Visit (HOSPITAL_COMMUNITY)
Admission: RE | Admit: 2020-05-04 | Discharge: 2020-05-04 | Disposition: A | Discharge status: Home / Self Care | Payer: 59 | Source: Ambulatory Visit | Attending: Internal Medicine | Admitting: Internal Medicine

## 2020-05-04 ENCOUNTER — Encounter (HOSPITAL_COMMUNITY): Payer: Self-pay

## 2020-05-04 ENCOUNTER — Encounter (INDEPENDENT_AMBULATORY_CARE_PROVIDER_SITE_OTHER): Payer: Self-pay

## 2020-05-04 ENCOUNTER — Other Ambulatory Visit: Payer: Self-pay

## 2020-05-04 ENCOUNTER — Encounter (HOSPITAL_COMMUNITY)
Admission: RE | Admit: 2020-05-04 | Discharge: 2020-05-04 | Disposition: A | Discharge status: Home / Self Care | Payer: 59 | Source: Ambulatory Visit | Attending: Internal Medicine | Admitting: Internal Medicine

## 2020-05-04 DIAGNOSIS — K529 Noninfective gastroenteritis and colitis, unspecified: Secondary | ICD-10-CM

## 2020-05-04 DIAGNOSIS — K625 Hemorrhage of anus and rectum: Secondary | ICD-10-CM

## 2020-05-04 DIAGNOSIS — Z20822 Contact with and (suspected) exposure to covid-19: Secondary | ICD-10-CM | POA: Insufficient documentation

## 2020-05-04 DIAGNOSIS — Z01812 Encounter for preprocedural laboratory examination: Secondary | ICD-10-CM | POA: Insufficient documentation

## 2020-05-04 DIAGNOSIS — K219 Gastro-esophageal reflux disease without esophagitis: Secondary | ICD-10-CM

## 2020-05-04 DIAGNOSIS — Z1211 Encounter for screening for malignant neoplasm of colon: Secondary | ICD-10-CM

## 2020-05-04 LAB — PREGNANCY, URINE: Preg Test, Ur: NEGATIVE

## 2020-05-05 LAB — SARS CORONAVIRUS 2 (TAT 6-24 HRS): SARS Coronavirus 2: NEGATIVE

## 2020-05-06 ENCOUNTER — Ambulatory Visit (HOSPITAL_COMMUNITY)
Admission: RE | Admit: 2020-05-06 | Discharge: 2020-05-06 | Disposition: A | Discharge status: Home / Self Care | Payer: 59 | Attending: Internal Medicine | Admitting: Internal Medicine

## 2020-05-06 ENCOUNTER — Other Ambulatory Visit: Payer: Self-pay

## 2020-05-06 ENCOUNTER — Ambulatory Visit (HOSPITAL_COMMUNITY): Payer: 59 | Admitting: Anesthesiology

## 2020-05-06 ENCOUNTER — Encounter (HOSPITAL_COMMUNITY)
Admission: RE | Disposition: A | Discharge status: Home / Self Care | Payer: Self-pay | Source: Home / Self Care | Attending: Internal Medicine

## 2020-05-06 ENCOUNTER — Encounter (HOSPITAL_COMMUNITY): Payer: Self-pay | Admitting: Internal Medicine

## 2020-05-06 DIAGNOSIS — K644 Residual hemorrhoidal skin tags: Secondary | ICD-10-CM | POA: Diagnosis not present

## 2020-05-06 DIAGNOSIS — K219 Gastro-esophageal reflux disease without esophagitis: Secondary | ICD-10-CM | POA: Diagnosis not present

## 2020-05-06 DIAGNOSIS — K625 Hemorrhage of anus and rectum: Secondary | ICD-10-CM | POA: Insufficient documentation

## 2020-05-06 DIAGNOSIS — Z79899 Other long term (current) drug therapy: Secondary | ICD-10-CM | POA: Diagnosis not present

## 2020-05-06 DIAGNOSIS — K529 Noninfective gastroenteritis and colitis, unspecified: Secondary | ICD-10-CM | POA: Insufficient documentation

## 2020-05-06 DIAGNOSIS — Z1211 Encounter for screening for malignant neoplasm of colon: Secondary | ICD-10-CM

## 2020-05-06 DIAGNOSIS — R197 Diarrhea, unspecified: Secondary | ICD-10-CM | POA: Diagnosis present

## 2020-05-06 DIAGNOSIS — Z885 Allergy status to narcotic agent status: Secondary | ICD-10-CM | POA: Diagnosis not present

## 2020-05-06 HISTORY — PX: BIOPSY: SHX5522

## 2020-05-06 HISTORY — PX: COLONOSCOPY WITH PROPOFOL: SHX5780

## 2020-05-06 HISTORY — PX: ESOPHAGOGASTRODUODENOSCOPY (EGD) WITH PROPOFOL: SHX5813

## 2020-05-06 SURGERY — ESOPHAGOGASTRODUODENOSCOPY (EGD) WITH PROPOFOL
Anesthesia: General

## 2020-05-06 MED ORDER — MIDAZOLAM HCL 2 MG/2ML IJ SOLN
INTRAMUSCULAR | Status: AC
  Filled 2020-05-06: qty 2

## 2020-05-06 MED ORDER — LIDOCAINE HCL (CARDIAC) PF 100 MG/5ML IV SOSY
PREFILLED_SYRINGE | INTRAVENOUS | Status: DC | PRN
  Administered 2020-05-06: 50 mg via INTRAVENOUS

## 2020-05-06 MED ORDER — STERILE WATER FOR IRRIGATION IR SOLN: Status: DC | PRN

## 2020-05-06 MED ORDER — PROPOFOL 500 MG/50ML IV EMUL
INTRAVENOUS | Status: DC | PRN
  Administered 2020-05-06: 150 ug/kg/min via INTRAVENOUS

## 2020-05-06 MED ORDER — LACTATED RINGERS IV SOLN: INTRAVENOUS | Status: DC

## 2020-05-06 MED ORDER — CHLORHEXIDINE GLUCONATE CLOTH 2 % EX PADS: 6.0000 | MEDICATED_PAD | Freq: Once | CUTANEOUS | Status: DC

## 2020-05-06 MED ORDER — MIDAZOLAM HCL 2 MG/2ML IJ SOLN
INTRAMUSCULAR | Status: DC | PRN
  Administered 2020-05-06: 2 mg via INTRAVENOUS

## 2020-05-06 MED ORDER — PROPOFOL 10 MG/ML IV BOLUS
INTRAVENOUS | Status: DC | PRN
  Administered 2020-05-06: 60 mg via INTRAVENOUS
  Administered 2020-05-06: 100 mg via INTRAVENOUS
  Administered 2020-05-06: 40 mg via INTRAVENOUS

## 2020-05-06 MED ORDER — PANTOPRAZOLE SODIUM 40 MG PO TBEC: 40.0000 mg | DELAYED_RELEASE_TABLET | Freq: Every day | ORAL | 5 refills | Status: DC

## 2020-05-06 NOTE — Anesthesia Procedure Notes (Signed)
Date/Time: 05/06/2020 10:20 AM Performed by: Orlie Dakin, CRNA Pre-anesthesia Checklist: Patient identified, Emergency Drugs available, Suction available and Patient being monitored Patient Re-evaluated:Patient Re-evaluated prior to induction Oxygen Delivery Method: Nasal cannula Induction Type: IV induction Placement Confirmation: positive ETCO2

## 2020-05-06 NOTE — Transfer of Care (Signed)
Immediate Anesthesia Transfer of Care Note  Patient: Brianna Bridges  Procedure(s) Performed: ESOPHAGOGASTRODUODENOSCOPY (EGD) WITH PROPOFOL (N/A ) COLONOSCOPY WITH PROPOFOL (N/A ) BIOPSY  Patient Location: Short Stay  Anesthesia Type:General  Level of Consciousness: awake, alert  and oriented  Airway & Oxygen Therapy: Patient Spontanous Breathing  Post-op Assessment: Report given to RN, Post -op Vital signs reviewed and stable and Patient moving all extremities X 4  Post vital signs: Reviewed and stable  Last Vitals:  Vitals Value Taken Time  BP    Temp    Pulse    Resp    SpO2      Last Pain:  Vitals:   05/06/20 0851  TempSrc: Oral  PainSc: 0-No pain      Patients Stated Pain Goal: 6 (49/82/64 1583)  Complications: No complications documented.

## 2020-05-06 NOTE — Discharge Instructions (Signed)
No aspirin or NSAIDs for 24 hours. Resume usual medications and diet as before. No driving for 24 hours. Physician will call with biopsy results.      Upper Endoscopy, Adult, Care After This sheet gives you information about how to care for yourself after your procedure. Your health care provider may also give you more specific instructions. If you have problems or questions, contact your health care provider. What can I expect after the procedure? After the procedure, it is common to have:  A sore throat.  Mild stomach pain or discomfort.  Bloating.  Nausea. Follow these instructions at home:  Follow instructions from your health care provider about what to eat or drink after your procedure.  Return to your normal activities as told by your health care provider. Ask your health care provider what activities are safe for you.  Take over-the-counter and prescription medicines only as told by your health care provider.  If you were given a sedative during the procedure, it can affect you for several hours. Do not drive or operate machinery until your health care provider says that it is safe.  Keep all follow-up visits as told by your health care provider. This is important.   Contact a health care provider if you have:  A sore throat that lasts longer than one day.  Trouble swallowing. Get help right away if:  You vomit blood or your vomit looks like coffee grounds.  You have: ? A fever. ? Bloody, black, or tarry stools. ? A severe sore throat or you cannot swallow. ? Difficulty breathing. ? Severe pain in your chest or abdomen. Summary  After the procedure, it is common to have a sore throat, mild stomach discomfort, bloating, and nausea.  If you were given a sedative during the procedure, it can affect you for several hours. Do not drive or operate machinery until your health care provider says that it is safe.  Follow instructions from your health care provider  about what to eat or drink after your procedure.  Return to your normal activities as told by your health care provider. This information is not intended to replace advice given to you by your health care provider. Make sure you discuss any questions you have with your health care provider. Document Revised: 01/22/2019 Document Reviewed: 06/26/2017 Elsevier Patient Education  2021 [place].     Colonoscopy, Adult, Care After This sheet gives you information about how to care for yourself after your procedure. Your doctor may also give you more specific instructions. If you have problems or questions, call your doctor. What can I expect after the procedure? After the procedure, it is common to have:  A small amount of blood in your poop (stool) for 24 hours.  Some gas.  Mild cramping or bloating in your belly (abdomen). Follow these instructions at home: Eating and drinking  Drink enough fluid to keep your pee (urine) pale yellow.  Follow instructions from your doctor about what you cannot eat or drink.  Return to your normal diet as told by your doctor. Avoid heavy or fried foods that are hard to digest.   Activity  Rest as told by your doctor.  Do not sit for a long time without moving. Get up to take short walks every 1-2 hours. This is important. Ask for help if you feel weak or unsteady.  Return to your normal activities as told by your doctor. Ask your doctor what activities are safe for you. To help  cramping and bloating:  Try walking around.  Put heat on your belly as told by your doctor. Use the heat source that your doctor recommends, such as a moist heat pack or a heating pad. ? Put a towel between your skin and the heat source. ? Leave the heat on for 20-30 minutes. ? Remove the heat if your skin turns bright red. This is very important if you are unable to feel pain, heat, or cold. You may have a greater risk of getting burned.   General instructions  If  you were given a medicine to help you relax (sedative) during your procedure, it can affect you for many hours. Do not drive or use machinery until your doctor says that it is safe.  For the first 24 hours after the procedure: ? Do not sign important documents. ? Do not drink alcohol. ? Do your daily activities more slowly than normal. ? Eat foods that are soft and easy to digest.  Take over-the-counter or prescription medicines only as told by your doctor.  Keep all follow-up visits as told by your doctor. This is important. Contact a doctor if:  You have blood in your poop 2-3 days after the procedure. Get help right away if:  You have more than a small amount of blood in your poop.  You see large clumps of tissue (blood clots) in your poop.  Your belly is swollen.  You feel like you may vomit (nauseous).  You vomit.  You have a fever.  You have belly pain that gets worse, and medicine does not help your pain. Summary  After the procedure, it is common to have a small amount of blood in your poop. You may also have mild cramping and bloating in your belly.  If you were given a medicine to help you relax (sedative) during your procedure, it can affect you for many hours. Do not drive or use machinery until your doctor says that it is safe.  Get help right away if you have a lot of blood in your poop, feel like you may vomit, have a fever, or have more belly pain. This information is not intended to replace advice given to you by your health care provider. Make sure you discuss any questions you have with your health care provider. Document Revised: 11/30/2018 Document Reviewed: 08/20/2018 Elsevier Patient Education  2021 [place] After This sheet gives you information about how to care for yourself after your procedure. Your health care provider may also give you more specific instructions. If you have problems or questions, contact  your health care provider. What can I expect after the procedure? After the procedure, it is common to have:  Tiredness.  Forgetfulness about what happened after the procedure.  Impaired judgment for important decisions.  Nausea or vomiting.  Some difficulty with balance. Follow these instructions at home: For the time period you were told by your health care provider:  Rest as needed.  Do not participate in activities where you could fall or become injured.  Do not drive or use machinery.  Do not drink alcohol.  Do not take sleeping pills or medicines that cause drowsiness.  Do not make important decisions or sign legal documents.  Do not take care of children on your own.      Eating and drinking  Follow the diet that is recommended by your health care provider.  Drink enough fluid to keep your urine  pale yellow.  If you vomit: ? Drink water, juice, or soup when you can drink without vomiting. ? Make sure you have little or no nausea before eating solid foods. General instructions  Have a responsible adult stay with you for the time you are told. It is important to have someone help care for you until you are awake and alert.  Take over-the-counter and prescription medicines only as told by your health care provider.  If you have sleep apnea, surgery and certain medicines can increase your risk for breathing problems. Follow instructions from your health care provider about wearing your sleep device: ? Anytime you are sleeping, including during daytime naps. ? While taking prescription pain medicines, sleeping medicines, or medicines that make you drowsy.  Avoid smoking.  Keep all follow-up visits as told by your health care provider. This is important. Contact a health care provider if:  You keep feeling nauseous or you keep vomiting.  You feel light-headed.  You are still sleepy or having trouble with balance after 24 hours.  You develop a rash.  You  have a fever.  You have redness or swelling around the IV site. Get help right away if:  You have trouble breathing.  You have new-onset confusion at home. Summary  For several hours after your procedure, you may feel tired. You may also be forgetful and have poor judgment.  Have a responsible adult stay with you for the time you are told. It is important to have someone help care for you until you are awake and alert.  Rest as told. Do not drive or operate machinery. Do not drink alcohol or take sleeping pills.  Get help right away if you have trouble breathing, or if you suddenly become confused. This information is not intended to replace advice given to you by your health care provider. Make sure you discuss any questions you have with your health care provider. Document Revised: 10/10/2019 Document Reviewed: 12/27/2018 Elsevier Patient Education  2021 [employer].

## 2020-05-06 NOTE — Anesthesia Preprocedure Evaluation (Signed)
Anesthesia Evaluation  Patient identified by MRN, date of birth, ID band Patient awake    Reviewed: Allergy & Precautions, NPO status , Patient's Chart, lab work & pertinent test results  Airway Mallampati: I  TM Distance: >3 FB Neck ROM: Full    Dental no notable dental hx. (+) Dental Advisory Given, Teeth Intact Permanent retainers:   Pulmonary neg pulmonary ROS,    Pulmonary exam normal breath sounds clear to auscultation       Cardiovascular Exercise Tolerance: Good Normal cardiovascular exam Rhythm:Regular Rate:Normal     Neuro/Psych negative neurological ROS  negative psych ROS   GI/Hepatic GERD  Medicated,(+) Hepatitis -, A  Endo/Other  negative endocrine ROS  Renal/GU negative Renal ROS   Polycystic ovarian syndrome     Musculoskeletal negative musculoskeletal ROS (+)   Abdominal   Peds  Hematology negative hematology ROS (+)   Anesthesia Other Findings   Reproductive/Obstetrics negative OB ROS                             Anesthesia Physical  Anesthesia Plan  ASA: I  Anesthesia Plan: General   Post-op Pain Management:    Induction: Intravenous  PONV Risk Score and Plan: Propofol infusion  Airway Management Planned: Nasal Cannula and Natural Airway  Additional Equipment:   Intra-op Plan:   Post-operative Plan:   Informed Consent: I have reviewed the patients History and Physical, chart, labs and discussed the procedure including the risks, benefits and alternatives for the proposed anesthesia with the patient or authorized representative who has indicated his/her understanding and acceptance.     Dental advisory given  Plan Discussed with: CRNA and Surgeon  Anesthesia Plan Comments:         Anesthesia Quick Evaluation

## 2020-05-06 NOTE — Op Note (Signed)
[facility] Patient Name: Brianna Bridges Procedure Date: 05/06/2020 10:26 AM MRN: [id number] Date of Birth: 1995-06-02 Attending MD: Hildred Laser , MD CSN: [id number] Age: 25 Admit Type: Outpatient Procedure:                Colonoscopy Indications:              Chronic diarrhea, Rectal bleeding Providers:                Hildred Laser, MD, Lurline Del, RN, Casimer Bilis, Technician Referring MD:             Koleen Distance. Gottschalk, DO Medicines:                Propofol per Anesthesia Complications:            No immediate complications. Estimated Blood Loss:     Estimated blood loss was minimal. Procedure:                Pre-Anesthesia Assessment:                           - Prior to the procedure, a History and Physical                            was performed, and patient medications and                            allergies were reviewed. The patient's tolerance of                            previous anesthesia was also reviewed. The risks                            and benefits of the procedure and the sedation                            options and risks were discussed with the patient.                            All questions were answered, and informed consent                            was obtained. Prior Anticoagulants: The patient has                            taken no previous anticoagulant or antiplatelet                            agents. ASA Grade Assessment: I - A normal, healthy                            patient. After reviewing the risks and benefits,  the patient was deemed in satisfactory condition to                            undergo the procedure.                           After obtaining informed consent, the colonoscope                            was passed under direct vision. Throughout the                            procedure, the patient's blood pressure, pulse, and                             oxygen saturations were monitored continuously. The                            [id number]-[id number] ([id number]) scope was introduced through                            the anus and advanced to the the terminal ileum,                            with identification of the appendiceal orifice and                            IC valve. The colonoscopy was performed without                            difficulty. The patient tolerated the procedure                            well. The quality of the bowel preparation was                            good. The terminal ileum, ileocecal valve,                            appendiceal orifice, and rectum were photographed. Scope In: 10:29:07 AM Scope Out: 10:48:50 AM Scope Withdrawal Time: 0 hours 10 minutes 3 seconds  Total Procedure Duration: 0 hours 19 minutes 43 seconds  Findings:      The perianal and digital rectal examinations were normal.      The terminal ileum appeared normal.      The colon (entire examined portion) appeared normal.      Biopsies for histology were taken with a cold forceps from the ascending       colon, descending colon and sigmoid colon for evaluation of microscopic       colitis. Specimen placed in bottle #2      External hemorrhoids were found during retroflexion. The hemorrhoids       were small. Impression:               - The examined portion of the ileum was normal.                           -  The entire examined colon is normal.                           - External hemorrhoids.                           - Biopsies were taken with a cold forceps from the                            ascending colon, descending colon and sigmoid colon                            for evaluation of microscopic colitis. Moderate Sedation:      Per Anesthesia Care Recommendation:           - Patient has a contact number available for                            emergencies. The signs and symptoms of potential                            delayed  complications were discussed with the                            patient. Return to normal activities tomorrow.                            Written discharge instructions were provided to the                            patient.                           - Resume previous diet today.                           - Continue present medications.                           - No aspirin, ibuprofen, naproxen, or other                            non-steroidal anti-inflammatory drugs for 1 day.                           - Await pathology results. Procedure Code(s):        --- Professional ---                           [phone], Colonoscopy, flexible; with biopsy, single                            or multiple Diagnosis Code(s):        --- Professional ---                           K64.4, Residual hemorrhoidal  skin tags                           K52.9, Noninfective gastroenteritis and colitis,                            unspecified                           K62.5, Hemorrhage of anus and rectum CPT copyright 2019 American Medical Association. All rights reserved. The codes documented in this report are preliminary and upon coder review may  be revised to meet current compliance requirements. Hildred Laser, MD Hildred Laser, MD 05/06/2020 11:05:35 AM This report has been signed electronically. Number of Addenda: 0

## 2020-05-06 NOTE — H&P (Signed)
Brianna Bridges is an 25 y.o. female.   Chief Complaint: Patient is here for esophagogastroduodenoscopy and colonoscopy. HPI: Patient is 25 year old Caucasian female who has GERD who symptoms are not well controlled with famotidine who also has chronic diarrhea felt to be due to IBS.  Her diarrhea has gotten worse lately and she had 2 episodes of blood per rectum.  She is therefore undergoing diagnostic evaluation.  She was given prescription for pantoprazole in the office.  She says she lost it while traveling.  Her symptoms have not changed any since she was last seen in the office but she has not had any more episodes of rectal bleeding. At times she feels as if food is going down slowly through her esophagus. Family history is negative for IBD or celiac disease.  Past Medical History:  Diagnosis Date  . Food intolerance   . GERD (gastroesophageal reflux disease)   . Hepatitis A   . IBS (irritable bowel syndrome)   . Polycystic ovary syndrome     Past Surgical History:  Procedure Laterality Date  . CHOLECYSTECTOMY N/A 01/16/2019   Procedure: LAPAROSCOPIC CHOLECYSTECTOMY;  Surgeon: [name], MD;  Location: [facility];  Service: General;  Laterality: N/A;  . EYE SURGERY Bilateral   . Foot surgery Left    Plantars wart  . LIVER BIOPSY N/A 01/16/2019   Procedure: LIVER BIOPSY;  Surgeon: [name], MD;  Location: [facility];  Service: General;  Laterality: N/A;  . WISDOM TOOTH EXTRACTION      Family History  Problem Relation Age of Onset  . GER disease Mother   . Hyperlipidemia Father   . High Cholesterol Father   . Healthy Sister    Social History:  reports that she has never smoked. She has never used smokeless tobacco. She reports current alcohol use. She reports that she does not use drugs.  Allergies:  Allergies  Allergen Reactions  . Oxycodone Itching    Medications Prior to Admission  Medication Sig Dispense Refill  . drospirenone-ethinyl estradiol (YASMIN)  3-0.03 MG tablet Take 1 tablet by mouth daily.    . hydroxypropyl methylcellulose / hypromellose (ISOPTO TEARS / GONIOVISC) 2.5 % ophthalmic solution Place 1 drop into both eyes 2 (two) times daily.    . hyoscyamine (LEVBID) 0.375 MG 12 hr tablet Take 1 tablet (0.375 mg total) by mouth daily before breakfast. (Patient taking differently: Take 0.1875 mg by mouth daily before breakfast.) 90 tablet 1  . ketoconazole (NIZORAL) 2 % shampoo Apply 1 application topically 2 (two) times a week.    . metroNIDAZOLE (METROCREAM) 0.75 % cream Apply 1 application topically at bedtime.    . polyethylene glycol-electrolytes (TRILYTE) 420 g solution Take 4,000 mLs by mouth as directed. 4000 mL 0  . sulfamethoxazole-trimethoprim (BACTRIM DS) 800-160 MG tablet Take 1 tablet by mouth 2 (two) times daily.    Marland Kitchen tretinoin (RETIN-A) 0.025 % cream Apply 1 application topically at bedtime.     . pantoprazole (PROTONIX) 40 MG tablet Take 1 tablet (40 mg total) by mouth daily before breakfast. 60 tablet 3    Results for orders placed or performed during the hospital encounter of 05/04/20 (from the past 48 hour(s))  SARS CORONAVIRUS 2 (TAT 6-24 HRS) Nasopharyngeal Nasopharyngeal Swab     Status: None   Collection Time: 05/04/20  2:02 PM   Specimen: Nasopharyngeal Swab  Result Value Ref Range   SARS Coronavirus 2 NEGATIVE NEGATIVE    Comment: (NOTE) SARS-CoV-2 target nucleic acids  are NOT DETECTED.  The SARS-CoV-2 RNA is generally detectable in upper and lower respiratory specimens during the acute phase of infection. Negative results do not preclude SARS-CoV-2 infection, do not rule out co-infections with other pathogens, and should not be used as the sole basis for treatment or other patient management decisions. Negative results must be combined with clinical observations, patient history, and epidemiological information. The expected result is Negative.  Fact Sheet for  Patients: SugarRoll.be  Fact Sheet for Healthcare Providers: https://www.woods-mathews.com/  This test is not yet approved or cleared by the [place] FDA and  has been authorized for detection and/or diagnosis of SARS-CoV-2 by FDA under an Emergency Use Authorization (EUA). This EUA will remain  in effect (meaning this test can be used) for the duration of the COVID-19 declaration under Se ction 564(b)(1) of the Act, 21 U.S.C. section 360bbb-3(b)(1), unless the authorization is terminated or revoked sooner.  Performed at [facility], [place] [address] [place], [place] [address]   Pregnancy, urine     Status: None   Collection Time: 05/04/20  3:36 PM  Result Value Ref Range   Preg Test, Ur NEGATIVE NEGATIVE    Comment: Performed at [facility], [address], [place], [place] [address]   No results found.  Review of Systems  Blood pressure 117/82, pulse 89, temperature 98.4 F (36.9 C), temperature source Oral, resp. rate 14, last menstrual period 05/06/2020, SpO2 97 %. Physical Exam HENT:     Mouth/Throat:     Mouth: Mucous membranes are moist.     Pharynx: Oropharynx is clear.  Eyes:     General: No scleral icterus.    Conjunctiva/sclera: Conjunctivae normal.  Cardiovascular:     Rate and Rhythm: Normal rate and regular rhythm.     Heart sounds: Normal heart sounds. No murmur heard.   Pulmonary:     Effort: Pulmonary effort is normal.     Breath sounds: Normal breath sounds.  Abdominal:     Comments: Abdomen is flat with laparoscopy scars from prior cholecystectomy.  Abdomen is soft.  She has mild tenderness in both iliac fossae and epigastric region.  No organomegaly or masses.  Musculoskeletal:        General: No swelling.     Cervical back: Neck supple.  Lymphadenopathy:     Cervical: No cervical adenopathy.  Skin:    General: Skin is warm and dry.  Neurological:     Mental Status: She is alert.       Assessment/Plan  GERD with poor symptom control with histamine 2 blocker. Chronic diarrhea and now with rectal bleeding. Diagnostic esophagogastroduodenoscopy and colonoscopy.  Hildred Laser, MD 05/06/2020, 10:03 AM

## 2020-05-06 NOTE — Op Note (Signed)
[facility] Patient Name: Brianna Bridges Procedure Date: 05/06/2020 9:51 AM MRN: [id number] Date of Birth: [birth year] Attending MD: Hildred Laser , MD CSN: [id number] Age: 25 Admit Type: Outpatient Procedure:                Upper GI endoscopy Indications:              Gastro-esophageal reflux disease, Follow-up of                            gastro-esophageal reflux disease Providers:                Hildred Laser, MD, Lurline Del, RN, Casimer Bilis, Technician Referring MD:             Koleen Distance. Gottschalk, DO Medicines:                Propofol per Anesthesia Complications:            No immediate complications. Estimated Blood Loss:     Estimated blood loss was minimal. Procedure:                Pre-Anesthesia Assessment:                           - Prior to the procedure, a History and Physical                            was performed, and patient medications and                            allergies were reviewed. The patient's tolerance of                            previous anesthesia was also reviewed. The risks                            and benefits of the procedure and the sedation                            options and risks were discussed with the patient.                            All questions were answered, and informed consent                            was obtained. Prior Anticoagulants: The patient has                            taken no previous anticoagulant or antiplatelet                            agents. ASA Grade Assessment: I - A normal, healthy  patient. After reviewing the risks and benefits,                            the patient was deemed in satisfactory condition to                            undergo the procedure.                           After obtaining informed consent, the endoscope was                            passed under direct vision. Throughout the                             procedure, the patient's blood pressure, pulse, and                            oxygen saturations were monitored continuously. The                            [id number]-[id number] ([id number]) scope was introduced through the                            mouth, and advanced to the second part of duodenum.                            The upper GI endoscopy was accomplished without                            difficulty. The patient tolerated the procedure                            well. Scope In: 10:17:52 AM Scope Out: 10:24:24 AM Total Procedure Duration: 0 hours 6 minutes 32 seconds  Findings:      The hypopharynx was normal.      The examined esophagus was normal.      The Z-line was regular and was found 40 cm from the incisors.      The entire examined stomach was normal.      The duodenal bulb and second portion of the duodenum were normal.       Biopsies for histology were taken with a cold forceps for evaluation of       celiac disease. The pathology specimen was placed into Bottle Number 1. Impression:               - Normal hypopharynx.                           - Normal esophagus.                           - Z-line regular, 40 cm from the incisors.                           - Normal stomach.                           -  Normal duodenal bulb and second portion of the                            duodenum. Biopsied. Moderate Sedation:      Per Anesthesia Care Recommendation:           - Patient has a contact number available for                            emergencies. The signs and symptoms of potential                            delayed complications were discussed with the                            patient. Return to normal activities tomorrow.                            Written discharge instructions were provided to the                            patient.                           - Resume previous diet today.                           - Continue present medications.                            - No aspirin, ibuprofen, naproxen, or other                            non-steroidal anti-inflammatory drugs for 1 day.                           - Await pathology results. Procedure Code(s):        --- Professional ---                           [phone], Esophagogastroduodenoscopy, flexible,                            transoral; with biopsy, single or multiple Diagnosis Code(s):        --- Professional ---                           K21.9, Gastro-esophageal reflux disease without                            esophagitis CPT copyright 2019 American [employer]. All rights reserved. The codes documented in this report are preliminary and upon coder review may  be revised to meet current compliance requirements. Hildred Laser, MD Hildred Laser, MD 05/06/2020 10:59:17 AM This report has been signed electronically. Number of Addenda: 0

## 2020-05-06 NOTE — Anesthesia Postprocedure Evaluation (Signed)
Anesthesia Post Note  Patient: AURIELLA WIEAND  Procedure(s) Performed: ESOPHAGOGASTRODUODENOSCOPY (EGD) WITH PROPOFOL (N/A ) COLONOSCOPY WITH PROPOFOL (N/A ) BIOPSY  Patient location during evaluation: Phase II Anesthesia Type: General Level of consciousness: awake and alert and oriented Pain management: pain level controlled Respiratory status: spontaneous breathing and respiratory function stable Cardiovascular status: blood pressure returned to baseline and stable Postop Assessment: no apparent nausea or vomiting Anesthetic complications: no   No complications documented.   Last Vitals:  Vitals:   05/06/20 0851 05/06/20 1050  BP: 117/82 115/83  Pulse: 89 88  Resp: 14 18  Temp: 36.9 C 36.9 C  SpO2: 97% 100%    Last Pain:  Vitals:   05/06/20 1050  TempSrc: Oral  PainSc: 0-No pain                 Kathaleen Dudziak C Christpoher Sievers

## 2020-05-07 LAB — SURGICAL PATHOLOGY

## 2020-05-11 ENCOUNTER — Encounter (HOSPITAL_COMMUNITY): Payer: Self-pay | Admitting: Internal Medicine

## 2020-06-30 ENCOUNTER — Ambulatory Visit (INDEPENDENT_AMBULATORY_CARE_PROVIDER_SITE_OTHER): Payer: 59 | Admitting: Internal Medicine

## 2020-07-07 ENCOUNTER — Ambulatory Visit (INDEPENDENT_AMBULATORY_CARE_PROVIDER_SITE_OTHER): Payer: 59 | Admitting: Internal Medicine

## 2020-07-18 ENCOUNTER — Other Ambulatory Visit (INDEPENDENT_AMBULATORY_CARE_PROVIDER_SITE_OTHER): Payer: Self-pay | Admitting: Internal Medicine

## 2020-10-27 ENCOUNTER — Encounter (INDEPENDENT_AMBULATORY_CARE_PROVIDER_SITE_OTHER): Payer: Self-pay | Admitting: Internal Medicine

## 2020-10-27 ENCOUNTER — Other Ambulatory Visit: Payer: Self-pay

## 2020-10-27 ENCOUNTER — Ambulatory Visit (INDEPENDENT_AMBULATORY_CARE_PROVIDER_SITE_OTHER): Payer: 59 | Admitting: Internal Medicine

## 2020-10-27 VITALS — BP 117/76 | HR 95 | Temp 98.9°F | Ht 67.0 in | Wt 154.5 lb

## 2020-10-27 DIAGNOSIS — K580 Irritable bowel syndrome with diarrhea: Secondary | ICD-10-CM | POA: Diagnosis not present

## 2020-10-27 DIAGNOSIS — K219 Gastro-esophageal reflux disease without esophagitis: Secondary | ICD-10-CM | POA: Diagnosis not present

## 2020-10-27 NOTE — Progress Notes (Signed)
Presenting complaint;  Follow for GERD and IBS.  Database and subjective:  Brianna Bridges is a 25 year old Caucasian female who was history of GERD and IBS who is here for scheduled visit.  She had EGD and colonoscopy in March 2022. EGD was normal.  Duodenal biopsy did not show any changes of celiac disease. Colonoscopy revealed normal TI and normal colon other than external hemorrhoids.  Random colonic biopsies were negative for microscopic colitis. She was doing well when she was last seen 6 months ago. Patient states she was able to wean her self off left bed and pantoprazole about a month ago. She was seen by herbologist and begun on butyrate, DGL, ultra nutrient MVI, liver health, OPTi ferritin and digestive enzymes.  She was also advised to eliminate gluten from her diet but she decided not to do that.  She has cut back on intake of red meat. She says she is doing well.  She has 1-2 formed stools daily.  She denies abdominal pain nausea or vomiting.  She also denies melena or rectal bleeding. Her weight is only increased by 2 pounds since her last visit but she has gained 12 pounds in the last 2 years.  She says she goes to the gym 3-4 times a week.  She has been doing this for 6 months. She is not sure how long she would have to take these OTC products.   Current Medications: Outpatient Encounter Medications as of 10/27/2020  Medication Sig   drospirenone-ethinyl estradiol (YASMIN) 3-0.03 MG tablet Take 1 tablet by mouth daily.   hydroxypropyl methylcellulose / hypromellose (ISOPTO TEARS / GONIOVISC) 2.5 % ophthalmic solution Place 1 drop into both eyes 2 (two) times daily.   ketoconazole (NIZORAL) 2 % shampoo Apply 1 application topically 2 (two) times a week.   metroNIDAZOLE (METROCREAM) 0.75 % cream Apply 1 application topically at bedtime.   OVER THE COUNTER MEDICATION Butyrate complex one capsule with meals TID.   OVER THE COUNTER MEDICATION DGL Plus one capsule with each TID   OVER  THE COUNTER MEDICATION Ultra Nutrient Multivit capsule BID   OVER THE COUNTER MEDICATION Liver health one capsule per day.   OVER THE COUNTER MEDICATION OptiFerin-C one capsule per day.   OVER THE COUNTER MEDICATION Digestive enzymes Ultra one capsule with each meal TID   tretinoin (RETIN-A) 0.025 % cream Apply 1 application topically at bedtime.    famotidine (PEPCID) 40 MG tablet TAKE 1 TABLET BY MOUTH EVERYDAY AT BEDTIME (Patient not taking: Reported on 10/27/2020)   hyoscyamine (LEVBID) 0.375 MG 12 hr tablet Take 1 tablet (0.375 mg total) by mouth daily before breakfast. (Patient not taking: Reported on 10/27/2020)   pantoprazole (PROTONIX) 40 MG tablet Take 1 tablet (40 mg total) by mouth daily before breakfast. (Patient not taking: Reported on 10/27/2020)   [DISCONTINUED] polyethylene glycol-electrolytes (TRILYTE) 420 g solution Take 4,000 mLs by mouth as directed.   [DISCONTINUED] sulfamethoxazole-trimethoprim (BACTRIM DS) 800-160 MG tablet Take 1 tablet by mouth 2 (two) times daily.   No facility-administered encounter medications on file as of 10/27/2020.     Objective: Blood pressure 117/76, pulse 95, temperature 98.9 F (37.2 C), temperature source Oral, height 5' 7"  (1.702 m), weight 154 lb 8 oz (70.1 kg). Patient is alert and in no acute distress. Conjunctiva is pink. Sclera is nonicteric Oropharyngeal mucosa is normal. No neck masses or thyromegaly noted. Cardiac exam with regular rhythm normal S1 and S2. No murmur or gallop noted. Lungs are clear to auscultation. Abdomen  is symmetrical soft and nontender with organomegaly or masses. No LE edema or clubbing noted.  Labs/studies Results:   CBC Latest Ref Rng & Units 02/11/2020 10/24/2019 01/29/2019  WBC 4.0 - 10.5 K/uL 8.6 5.2 6.4  Hemoglobin 12.0 - 15.0 g/dL 14.6 14.1 14.3  Hematocrit 36.0 - 46.0 % 44.1 43.4 43.6  Platelets 150 - 400 K/uL 375 264 281    CMP Latest Ref Rng & Units 02/11/2020 05/07/2019 01/29/2019  Glucose 70 -  99 mg/dL 100(H) - 95  BUN 6 - 20 mg/dL 13 - 13  Creatinine 0.44 - 1.00 mg/dL 0.83 - 0.79  Sodium 135 - 145 mmol/L 140 - 138  Potassium 3.5 - 5.1 mmol/L 3.9 - 4.4  Chloride 98 - 111 mmol/L 104 - 102  CO2 22 - 32 mmol/L 25 - 23  Calcium 8.9 - 10.3 mg/dL 9.5 - 9.5  Total Protein 6.5 - 8.1 g/dL 7.6 6.7 7.8  Total Bilirubin 0.3 - 1.2 mg/dL 0.7 <0.2 0.6  Alkaline Phos 38 - 126 U/L 34(L) 35(L) 32(L)  AST 15 - 41 U/L 21 20 19   ALT 0 - 44 U/L 31 24 26     Hepatic Function Latest Ref Rng & Units 02/11/2020 05/07/2019 01/29/2019  Total Protein 6.5 - 8.1 g/dL 7.6 6.7 7.8  Albumin 3.5 - 5.0 g/dL 4.1 4.5 4.3  AST 15 - 41 U/L 21 20 19   ALT 0 - 44 U/L 31 24 26   Alk Phosphatase 38 - 126 U/L 34(L) 35(L) 32(L)  Total Bilirubin 0.3 - 1.2 mg/dL 0.7 <0.2 0.6  Bilirubin, Direct 0.00 - 0.40 mg/dL - 0.08 -   Above lab data reviewed. No recent lab data available.  Assessment:  #1.  Chronic GERD.  She had essentially normal EGD 6 months ago.  She has been able to wean herself off PPI.  She is watching her diet and on OTC preparations as above.  It remains to be seen how she would do when she stopped using these medications.  She also needs to find out how long she will have to stay on these products.  #2.  Chronic diarrhea secondary to IBS.  She is not using antispasmodic anymore.  OTC preps seem to be helping.  I I believe stress level is also gone down as she has been working at her current assignment for 13 months.  Plan:  Patient will call if OTC prep stopped working. I would deftly recommend that she should come back for follow-up visit in 6 months as I would like to monitor her course. She also needs to find out how long she will need to be on these products.

## 2020-10-27 NOTE — Patient Instructions (Signed)
Notify if symptoms recur while on herbal medications

## 2020-12-05 IMAGING — CT CT ABD-PELV W/ CM
2 of 4 series · 15 of 46 positions shown, 17 images · IV contrast (Omnipaque or Isovue)
Comparison: No priors.

CLINICAL DATA: 23-year-old female with history of cholecystectomy
on [REDACTED], followed by right upper quadrant pain 2 days later.
Bright red blood per stool yesterday.

EXAM:
CT ABDOMEN AND PELVIS WITH CONTRAST
TECHNIQUE: Multidetector CT imaging of the abdomen and pelvis was performed
using the standard protocol following bolus administration of
intravenous contrast.
CONTRAST:  100mL OMNIPAQUE IOHEXOL 300 MG/ML  SOLN

[Series 2: axial st · axial · 0.67mm/px · z∈[-590,-190]mm · 12 of 92 slices shown, 14 images]
[im 6/92  soft-tissue]
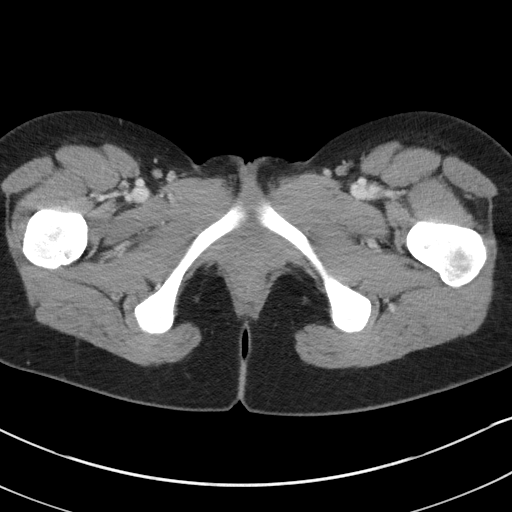
[im 6/92  bone]
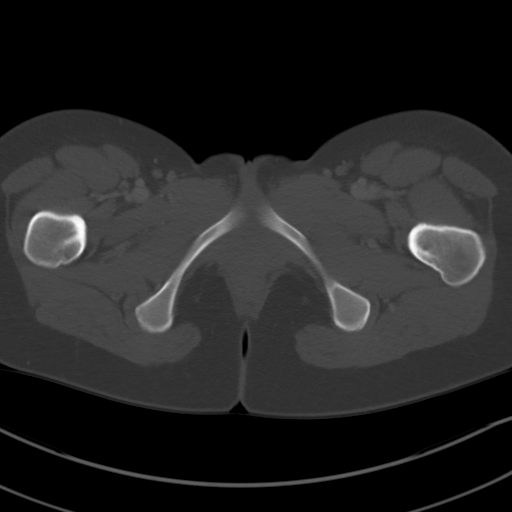
[im 16/92  soft-tissue]
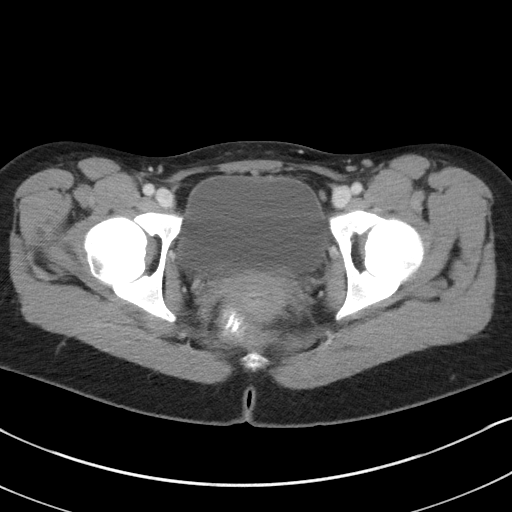
[im 21/92  soft-tissue]
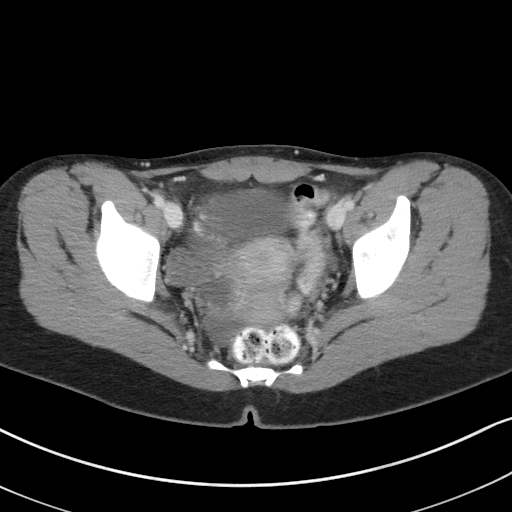
[im 26/92  soft-tissue]
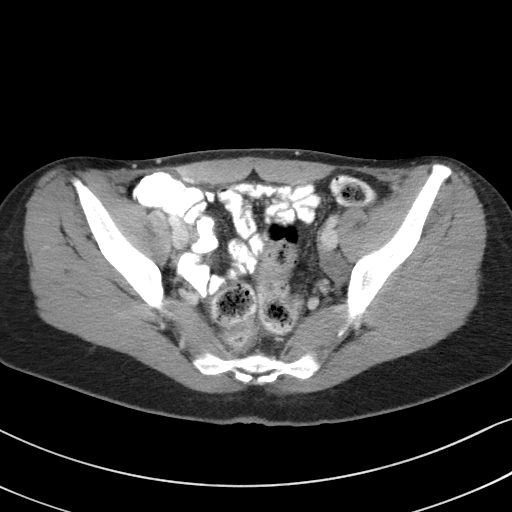
[im 36/92  soft-tissue]
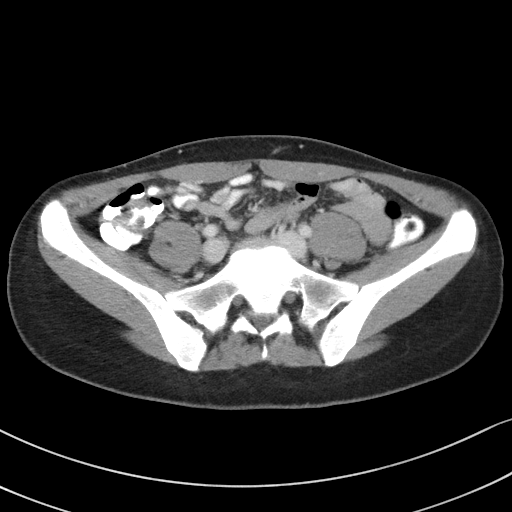
[im 41/92  soft-tissue]
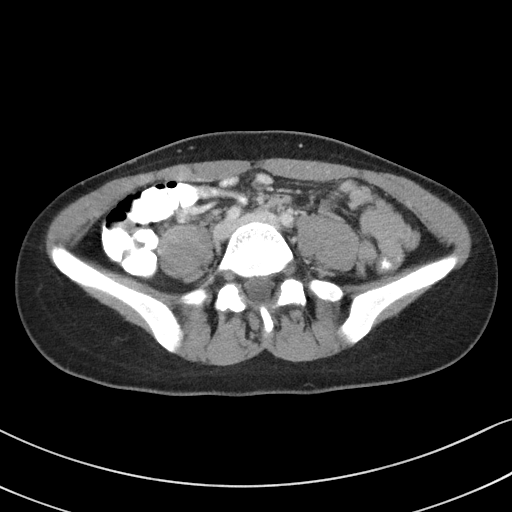
[im 51/92  soft-tissue]
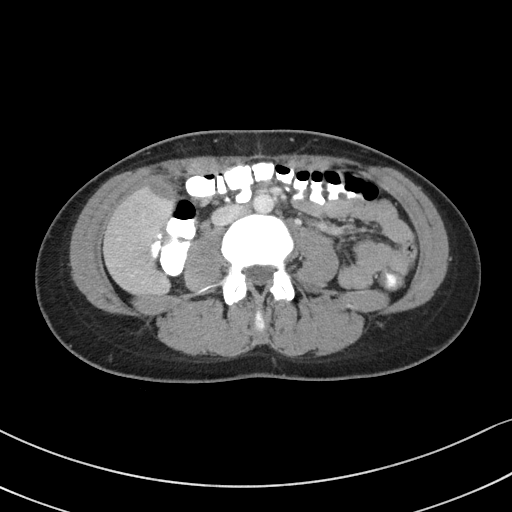
[im 56/92  soft-tissue]
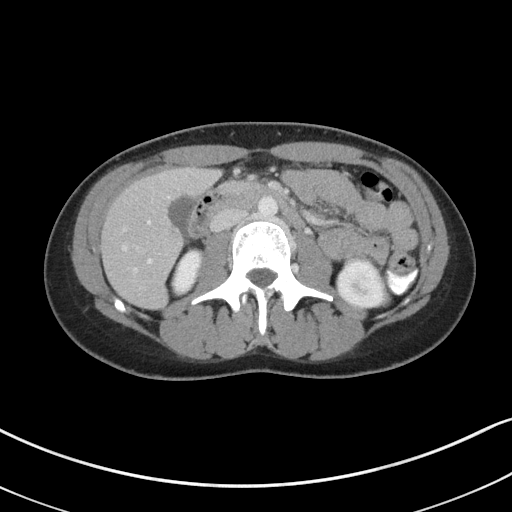
[im 66/92  soft-tissue]
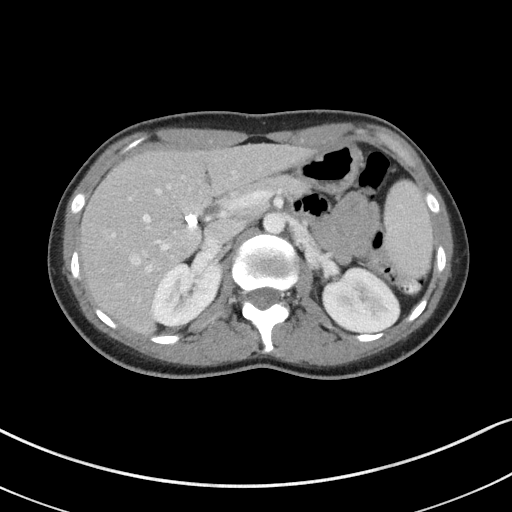
[im 66/92  bone]
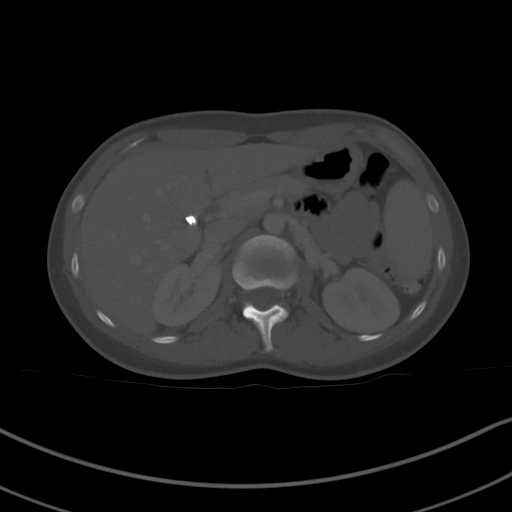
[im 71/92  soft-tissue]
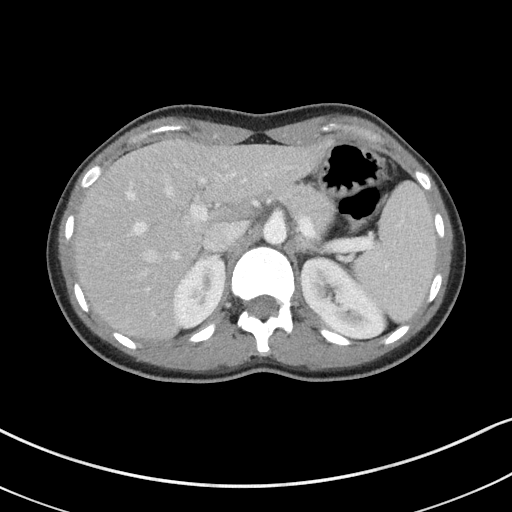
[im 76/92  soft-tissue]
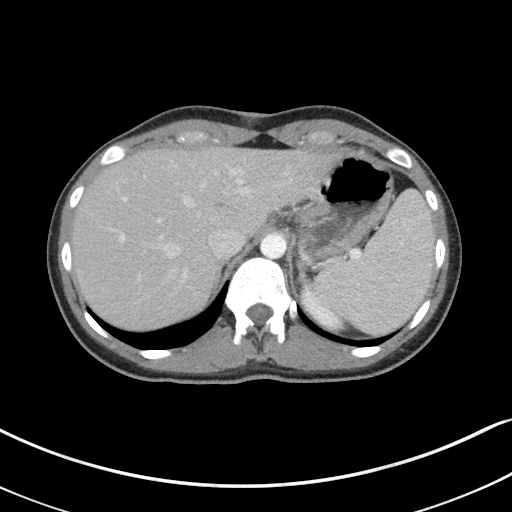
[im 86/92  soft-tissue]
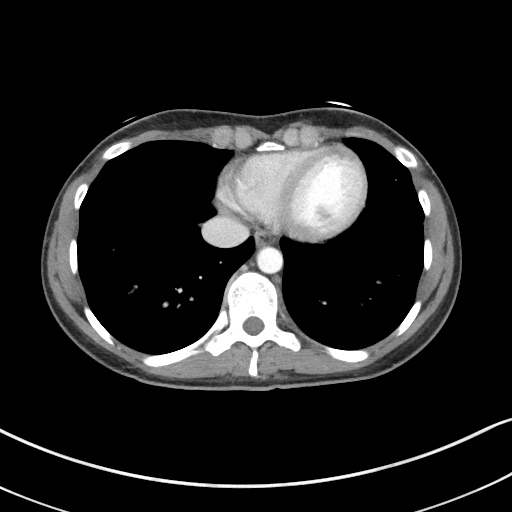

[Series 5: coronal st · coronal · 0.67mm/px · 3 of 68 slices shown]
[im 23/68  soft-tissue]
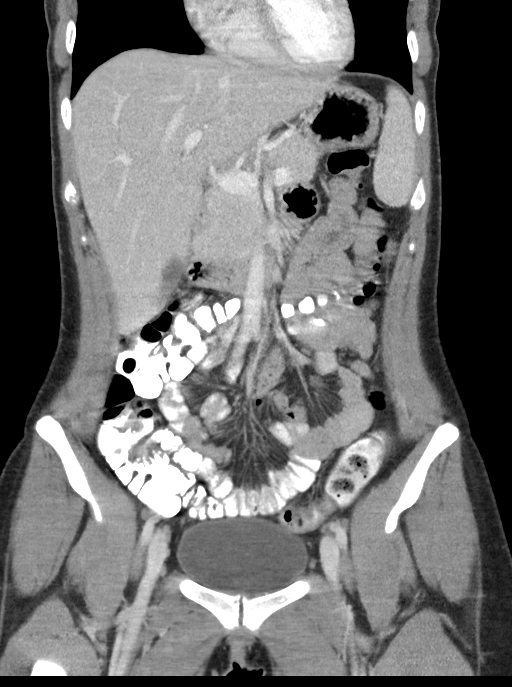
[im 30/68  soft-tissue]
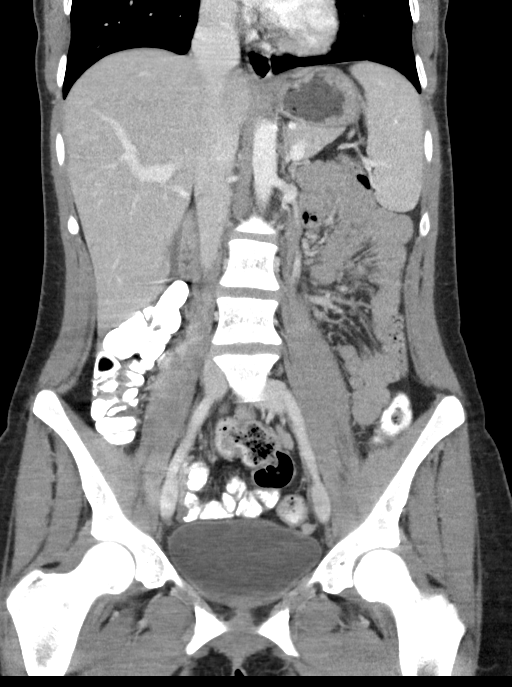
[im 38/68  soft-tissue]
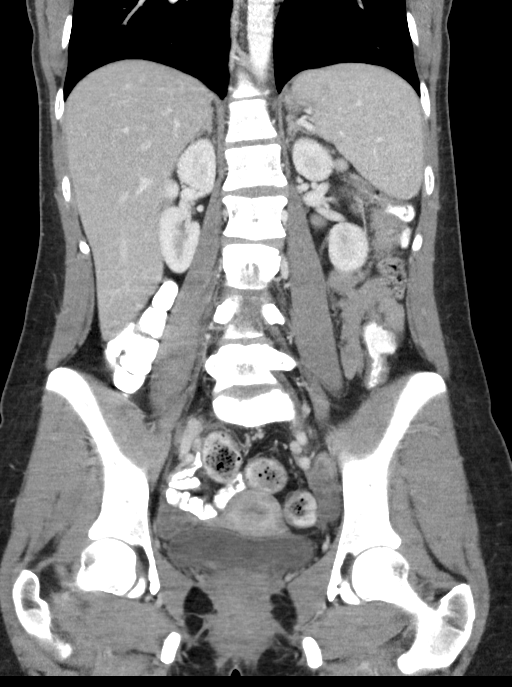

[15 of 46 positions shown; findings below may reference images not displayed]

FINDINGS: Lower chest: Unremarkable.

Hepatobiliary: No suspicious cystic or solid hepatic lesions. No
intra or extrahepatic biliary ductal dilatation. Status post
cholecystectomy. In gallbladder fossa there is a small postoperative
fluid collection which superiorly is low-intermediate attenuation,
and inferiorly is more intermediate to high attenuation. This
collection demonstrates no rim enhancement, and does not exert
significant mass effect upon adjacent structures, presumably a
resolving postoperative seroma/hematoma.

Pancreas: No pancreatic mass. No pancreatic ductal dilatation. No
pancreatic or peripancreatic fluid collections or inflammatory
changes.

Spleen: Unremarkable.

Adrenals/Urinary Tract: Bilateral kidneys and adrenal glands are
normal in appearance. No hydroureteronephrosis. Urinary bladder is
normal in appearance.

Stomach/Bowel: Normal appearance of the stomach. No pathologic
dilatation of small bowel or colon. Normal appendix.

Vascular/Lymphatic: No significant atherosclerotic disease, aneurysm
or dissection noted in the abdominal or pelvic vasculature. No
lymphadenopathy noted in the abdomen or pelvis.

Reproductive: Uterus and ovaries are unremarkable in appearance.

Other: Trace volume of free fluid in the cul-de-sac, presumably
physiologic in this young female patient. No larger volume of
ascites. No pneumoperitoneum.

Musculoskeletal: There are no aggressive appearing lytic or blastic
lesions noted in the visualized portions of the skeleton.
IMPRESSION: 1. Status post cholecystectomy. Tiny postoperative fluid collection
in the gallbladder fossa most compatible with a resolving
postoperative seroma/hematoma. No imaging features to clearly
indicate an abscess or biloma at this time.
2. Trace volume of free fluid in the cul-de-sac, presumably
physiologic in this young female patient.

## 2021-04-21 ENCOUNTER — Other Ambulatory Visit (INDEPENDENT_AMBULATORY_CARE_PROVIDER_SITE_OTHER): Payer: Self-pay | Admitting: Internal Medicine

## 2021-04-21 MED ORDER — DILTIAZEM GEL 2 %: 1.0000 "application " | Freq: Two times a day (BID) | CUTANEOUS | 2 refills | Status: DC

## 2021-04-21 NOTE — Progress Notes (Signed)
Patient called stating she was having painful defecation.  She feels she has developed anal fissure. ?Patient was called prescription for diltiazem gel. ? ?

## 2021-04-23 ENCOUNTER — Encounter (HOSPITAL_BASED_OUTPATIENT_CLINIC_OR_DEPARTMENT_OTHER): Payer: Self-pay | Admitting: *Deleted

## 2021-04-23 ENCOUNTER — Emergency Department (HOSPITAL_BASED_OUTPATIENT_CLINIC_OR_DEPARTMENT_OTHER)
Admission: EM | Admit: 2021-04-23 | Discharge: 2021-04-23 | Disposition: A | Discharge status: Home / Self Care | Payer: 59 | Attending: Emergency Medicine | Admitting: Emergency Medicine

## 2021-04-23 ENCOUNTER — Other Ambulatory Visit: Payer: Self-pay

## 2021-04-23 DIAGNOSIS — K644 Residual hemorrhoidal skin tags: Secondary | ICD-10-CM | POA: Diagnosis not present

## 2021-04-23 DIAGNOSIS — K6289 Other specified diseases of anus and rectum: Secondary | ICD-10-CM | POA: Diagnosis present

## 2021-04-23 LAB — OCCULT BLOOD X 1 CARD TO LAB, STOOL: Fecal Occult Bld: POSITIVE — AB

## 2021-04-23 MED ORDER — LIDOCAINE VISCOUS HCL 2 % MT SOLN: 15.0000 mL | OROMUCOSAL | 0 refills | Status: DC | PRN

## 2021-04-23 MED ORDER — SITZ BATH MISC: 1.0000 "application " | Freq: Every day | 0 refills | Status: DC | PRN

## 2021-04-23 MED ORDER — HYDROCORTISONE 1 % EX CREA: TOPICAL_CREAM | CUTANEOUS | 0 refills | Status: DC

## 2021-04-23 NOTE — ED Notes (Signed)
Discharge instructions discussed with pt. Pt verbalized understanding. Pt stable and ambulatory.  °

## 2021-04-23 NOTE — ED Provider Notes (Signed)
?[place] ?Provider Note ? ? ?CSN: [id number] ?Arrival date & time: 04/23/21  1615 ? ?  ? ?History ? ?Chief Complaint  ?Patient presents with  ? Rectal Pain  ? ? ?Brianna Bridges is a 26 y.o. female. With past medical history of chronic diarrhea, IBS, anal skin tag, rectal bleeding and hemorrhoids who presents to the emergency department with rectal pain. ? ?Patient states that she has history of anal fissure, anal tag, hemorrhoids, IBS and is followed by gastroenterology.  She states that since yesterday she has had pain with defecation.  She states that it feels like "shards of glass" when she is having a bowel movement.  Because of that she has been more hesitant to use the restroom.  She denies diarrhea or hematochezia, melena. ? ?HPI ? ?  ? ?Home Medications ?Prior to Admission medications   ?Medication Sig Start Date End Date Taking? Authorizing Provider  ?diltiazem 2 % GEL Apply 1 application. topically 2 (two) times daily. 04/21/21   Rogene Houston, MD  ?drospirenone-ethinyl estradiol (YASMIN) 3-0.03 MG tablet Take 1 tablet by mouth daily. 09/04/18   [provider]  ?famotidine (PEPCID) 40 MG tablet TAKE 1 TABLET BY MOUTH EVERYDAY AT BEDTIME ?Patient not taking: Reported on 10/27/2020 07/20/20   Harvel Quale, MD  ?hydroxypropyl methylcellulose / hypromellose (ISOPTO TEARS / GONIOVISC) 2.5 % ophthalmic solution Place 1 drop into both eyes 2 (two) times daily.    [provider]  ?hyoscyamine (LEVBID) 0.375 MG 12 hr tablet Take 1 tablet (0.375 mg total) by mouth daily before breakfast. ?Patient not taking: Reported on 10/27/2020 03/25/20   Rogene Houston, MD  ?ketoconazole (NIZORAL) 2 % shampoo Apply 1 application topically 2 (two) times a week. 02/20/20   [provider]  ?metroNIDAZOLE (METROCREAM) 0.75 % cream Apply 1 application topically at bedtime. 02/20/20   [provider]  ?OVER THE COUNTER MEDICATION Butyrate complex one  capsule with meals TID.    [provider]  ?OVER THE COUNTER MEDICATION DGL Plus one capsule with each TID    [provider]  ?OVER THE COUNTER MEDICATION Ultra Nutrient Multivit capsule BID    [provider]  ?OVER THE COUNTER MEDICATION Liver health one capsule per day.    [provider]  ?OVER THE COUNTER MEDICATION OptiFerin-C one capsule per day.    [provider]  ?OVER THE COUNTER MEDICATION Digestive enzymes Ultra one capsule with each meal TID    [provider]  ?pantoprazole (PROTONIX) 40 MG tablet Take 1 tablet (40 mg total) by mouth daily before breakfast. ?Patient not taking: Reported on 10/27/2020 05/06/20   Rogene Houston, MD  ?tretinoin (RETIN-A) 0.025 % cream Apply 1 application topically at bedtime.  09/05/18   [provider]  ?   ? ?Allergies    ?Oxycodone   ? ?Review of Systems   ?Review of Systems  ?Constitutional:  Negative for fever.  ?Gastrointestinal:  Positive for rectal pain. Negative for blood in stool.  ?All other systems reviewed and are negative. ? ?Physical Exam ?Updated Vital Signs ?BP (!) 139/93   Pulse 96   Temp 98.4 ?F (36.9 ?C)   Resp 18   Ht '5\' 8"'$  (1.727 m)   Wt 73.1 kg   LMP 04/05/2021   SpO2 99%   BMI 24.51 kg/m?  ?Physical Exam ?Vitals and nursing note reviewed. Exam conducted with a chaperone present.  ?Constitutional:   ?   General: She  is not in acute distress. ?   Appearance: Normal appearance. She is normal weight. She is not ill-appearing or toxic-appearing.  ?HENT:  ?   Head: Normocephalic and atraumatic.  ?Eyes:  ?   Extraocular Movements: Extraocular movements intact.  ?Pulmonary:  ?   Effort: Pulmonary effort is normal. No respiratory distress.  ?Abdominal:  ?   General: Abdomen is flat. Bowel sounds are normal. There is no distension.  ?   Palpations: Abdomen is soft.  ?   Tenderness: There is no abdominal tenderness.  ?Genitourinary: ?   Rectum: Guaiac result positive. Tenderness present.  No anal fissure or external hemorrhoid. Normal anal tone.  ?   Comments: Anal tag ?Musculoskeletal:     ?   General: Normal range of motion.  ?   Cervical back: Neck supple.  ?Skin: ?   General: Skin is warm and dry.  ?   Capillary Refill: Capillary refill takes less than 2 seconds.  ?Neurological:  ?   General: No focal deficit present.  ?   Mental Status: She is alert and oriented to person, place, and time. Mental status is at baseline.  ?Psychiatric:     ?   Mood and Affect: Mood normal.     ?   Behavior: Behavior normal.     ?   Thought Content: Thought content normal.     ?   Judgment: Judgment normal.  ? ? ?ED Results / Procedures / Treatments   ?Labs ?(all labs ordered are listed, but only abnormal results are displayed) ?Labs Reviewed  ?OCCULT BLOOD X 1 CARD TO LAB, STOOL - Abnormal; Notable for the following components:  ?    Result Value  ? Fecal Occult Bld POSITIVE (*)   ? All other components within normal limits  ? ?EKG ?None ? ?Radiology ?No results found. ? ?Procedures ?Procedures  ? ?Medications Ordered in ED ?Medications - No data to display ? ?ED Course/ Medical Decision Making/ A&P ?  ?                        ?Medical Decision Making ?Amount and/or Complexity of Data Reviewed ?Labs: ordered. ? ?Risk ?OTC drugs. ?Prescription drug management. ? ?This patient presents to the ED for concern of rectal pain, this involves an extensive number of treatment options, and is a complaint that carries with it a high risk of complications and morbidity.  The differential diagnosis includes anal fissure, thrombosed hemorrhoid, IBS, anal tag, etc. ? ?Co morbidities that complicate the patient evaluation ?Anal fissure, anal tag, hemorrhoids, IBS ? ?Additional history obtained:  ?Additional history obtained from: None ?External records from outside source obtained and reviewed including: Previous gastroenterology visits ? ?Lab Results: ?I personally ordered, reviewed, and interpreted labs. ?Pertinent results  include: ?Guaiac positive, likely chronic ? ?ED Course: ?26 year old female presents emergency department with rectal pain.  ? ?History and physical as noted above.  She does have a very tender anal tag on rectal exam.  This is likely the cause of her pain with defecation.  There are no anal fissures or thrombosed external hemorrhoids.  Guaiac result was positive, imagine this is chronic either from straining, IBS.  She is well-appearing and nontoxic in appearance.  She has no pertinent lightheadedness or dizziness, she has not visualized any blood in her stool or in the toilet or on wiping.  Do not think this is acute lower GI bleed. ? ?I have reviewed her medications.  It appears  that she is on diltiazem cream as well as using over-the-counter hemorrhoidal cream.  I will prescribe her lidocaine viscus for topical application as well as triple H cream, and sitz bath's for symptomatic relief of her symptoms.  She has a GI appointment on Tuesday and I have encouraged her to discuss her symptoms with GI.  They can also change her medications if they have more specified treatment for anal tag.  I have also sent an ambulatory referral to general surgery for removal of the anal tag.  Discussed that she does not have to follow-up if this is not congruent with her wishes however I have placed it as this would be definitive treatment of her symptoms.  She verbalized understanding and is agreeable to this plan. ? ?After consideration of the diagnostic results and the patients response to treatment, I feel that the patent would benefit from discharge. ?The patient has been appropriately medically screened and/or stabilized in the ED. I have low suspicion for any other emergent medical condition which would require further screening, evaluation or treatment in the ED or require inpatient management. The patient is overall well appearing and non-toxic in appearance. They are hemodynamically stable at time of discharge.   ?Final  Clinical Impression(s) / ED Diagnoses ?Final diagnoses:  ?Anal skin tag  ? ? ?Rx / DC Orders ?ED Discharge Orders   ? ?      Ordered  ?  lidocaine (XYLOCAINE) 2 % solution  As needed       ? 04/23/21 1733  ?  M

## 2021-04-23 NOTE — ED Triage Notes (Signed)
Rectal pain slight bleeding.  Hx of anal tag, fissure and hemorrhoids.   ?

## 2021-04-23 NOTE — Discharge Instructions (Addendum)
You were seen in the emergency department for rectal pain.  You do have an anal tag that is likely causing your symptoms.  I am prescribing you Preparation H cream as well as lidocaine cream and sitz bath's for you to use on the area.  These are usually used for hemorrhoids however may be helpful for the anal tag.  Please go to your GI appointment on Tuesday.  I have additionally referred you to general surgery to have the anal tag removed if that is something you wish to move forward with.  Please return to the emergency department for rectal bleeding with lightheadedness, shortness of breath. ?

## 2021-04-27 ENCOUNTER — Ambulatory Visit (INDEPENDENT_AMBULATORY_CARE_PROVIDER_SITE_OTHER): Payer: 59 | Admitting: Internal Medicine

## 2021-04-27 ENCOUNTER — Other Ambulatory Visit: Payer: Self-pay

## 2021-04-27 ENCOUNTER — Encounter (INDEPENDENT_AMBULATORY_CARE_PROVIDER_SITE_OTHER): Payer: Self-pay | Admitting: Internal Medicine

## 2021-04-27 DIAGNOSIS — K602 Anal fissure, unspecified: Secondary | ICD-10-CM | POA: Diagnosis not present

## 2021-04-27 NOTE — Progress Notes (Signed)
Presenting complaint; ? ?Painful defecation. ? ?Database and subjective: ? ?Patient is 26 year old Caucasian female with history of GERD and IBS who was last seen on 10/27/2020.  She was doing well on her last visit. ?Patient called on 04/21/2021 stating that she had developed anal fissure.  This occurred after she found herself to be constipated and had to strain.  Prescription for diltiazem gel was sent to her pharmacy.  Patient called on 3 07/24/2021 that she was not doing any better.  I recommended evaluation emergency room as there was no provider in the office. ?Patient was seen and discharged.  She is advised to use sitz bath's daily advised to use lidocaine 2% solution and as-needed basis.  She was also advised follow-up with general surgery. ? ?Patient states she is feeling much better.  Now she is having mild discomfort involving rectum/anal canal.  She started to feel better over the weekend.  She is applying diltiazem gel to anal canal and Preparation H on the outside.  She is also taking MiraLAX 17 g daily at bedtime.  She is having 1-2 bowel movements daily.  She has noted intermittent hematochezia.  She also has developed anal tag and she wonders if this could be banded or removed.  She has gained 8 pounds since her last visit.  She remains very active.  She lifts weights 3-4 times a week.  She is not having any side effects with diltiazem gel. ?She states she is using pantoprazole only on as-needed basis.  She has been able to control heartburn by watching her diet.  She is not taking supplements or herbal medications anymore. ? ?Current Medications: ?Outpatient Encounter Medications as of 04/27/2021  ?Medication Sig  ? diltiazem 2 % GEL Apply 1 application. topically 2 (two) times daily.  ? drospirenone-ethinyl estradiol (YASMIN) 3-0.03 MG tablet Take 1 tablet by mouth daily.  ? hydrocortisone cream 1 % Apply to affected area 2 times daily  ? hydroxypropyl methylcellulose / hypromellose (ISOPTO TEARS /  GONIOVISC) 2.5 % ophthalmic solution Place 1 drop into both eyes 2 (two) times daily.  ? ketoconazole (NIZORAL) 2 % shampoo Apply 1 application topically 2 (two) times a week.  ? metroNIDAZOLE (METROCREAM) 0.75 % cream Apply 1 application topically at bedtime.  ? OVER THE COUNTER MEDICATION Ultra Nutrient Multivit capsule BID  ? pantoprazole (PROTONIX) 40 MG tablet Take 1 tablet (40 mg total) by mouth daily before breakfast.  ? tretinoin (RETIN-A) 0.025 % cream Apply 1 application topically at bedtime.   ? famotidine (PEPCID) 40 MG tablet TAKE 1 TABLET BY MOUTH EVERYDAY AT BEDTIME (Patient not taking: Reported on 10/27/2020)  ? hyoscyamine (LEVBID) 0.375 MG 12 hr tablet Take 1 tablet (0.375 mg total) by mouth daily before breakfast. (Patient not taking: Reported on 10/27/2020)  ? Misc. Devices (SITZ BATH) MISC 1 application. by Does not apply route daily as needed. (Patient not taking: Reported on 04/27/2021)  ? [DISCONTINUED] lidocaine (XYLOCAINE) 2 % solution Use as directed 15 mLs in the mouth or throat as needed for mouth pain.  ? [DISCONTINUED] OVER THE COUNTER MEDICATION Butyrate complex one capsule with meals TID.  ? [DISCONTINUED] OVER THE COUNTER MEDICATION DGL Plus one capsule with each TID  ? [DISCONTINUED] OVER THE COUNTER MEDICATION Liver health one capsule per day.  ? [DISCONTINUED] OVER THE COUNTER MEDICATION OptiFerin-C one capsule per day.  ? [DISCONTINUED] OVER THE COUNTER MEDICATION Digestive enzymes Ultra one capsule with each meal TID  ? ?No facility-administered encounter medications on file  as of 04/27/2021.  ? ? ? ?Objective: ?Blood pressure 118/83, pulse 88, temperature 98.9 ?F (37.2 ?C), temperature source Oral, height '5\' 8"'$  (1.727 m), weight 162 lb (73.5 kg), last menstrual period 04/05/2021. ?Patient is alert and in no acute distress. ?Conjunctiva is pink. Sclera is nonicteric ?Oropharyngeal mucosa is normal. ?No neck masses or thyromegaly noted. ?Cardiac exam with regular rhythm normal S1  and S2. No murmur or gallop noted. ?Lungs are clear to auscultation. ?Abdomen is symmetrical soft and nontender with organomegaly or masses. ?Rectal examination was limited to external inspection.  She has small skin tag anteriorly.  Lower end of fissure is noted at midline posteriorly. ?No LE edema or clubbing noted. ? ? ?Assessment: ? ?#1.  Anal fissure.  She is responding to diltiazem gel.  She should continue this for total of 4 weeks.  If healing is not complete then she may need internal sphincterotomy. ? ?#2.  History of IBS.  He is to have diarrhea but lately she has been constipated.  She should be able to prevent constipation by dietary fiber and fiber supplement along with polyethylene glycol. ? ?#3.  History of GERD.  She is using pantoprazole on as-needed basis. ? ?Plan: ? ?Continue the diltiazem gel for total of 4 weeks at which time she will call office with progress report. ?We will check with Dr. Constance Haw regarding banding of anal tag. ?Office visit on as-needed basis. ? ? ? ? ? ? ?

## 2021-04-27 NOTE — Patient Instructions (Addendum)
Continue diltiazem Gel for total of 4 weeks. ?Progress report in one month ?

## 2021-06-01 ENCOUNTER — Ambulatory Visit
Admission: EM | Admit: 2021-06-01 | Discharge: 2021-06-01 | Disposition: A | Discharge status: Home / Self Care | Payer: 59 | Attending: Nurse Practitioner | Admitting: Nurse Practitioner

## 2021-06-01 ENCOUNTER — Encounter: Payer: Self-pay | Admitting: Emergency Medicine

## 2021-06-01 DIAGNOSIS — J020 Streptococcal pharyngitis: Secondary | ICD-10-CM

## 2021-06-01 LAB — POCT RAPID STREP A (OFFICE): Rapid Strep A Screen: POSITIVE — AB

## 2021-06-01 MED ORDER — LIDOCAINE VISCOUS HCL 2 % MT SOLN: 5.0000 mL | Freq: Four times a day (QID) | OROMUCOSAL | 0 refills | Status: AC | PRN

## 2021-06-01 MED ORDER — PENICILLIN V POTASSIUM 500 MG PO TABS: 500.0000 mg | ORAL_TABLET | Freq: Three times a day (TID) | ORAL | 0 refills | Status: AC

## 2021-06-01 NOTE — Discharge Instructions (Addendum)
Take medication as prescribed. ?May take ibuprofen or Tylenol for pain, fever, or general discomfort. ?Increase fluids and get plenty of rest. ?Warm saltwater gargles 3-4 times daily until symptoms improve. ?Soft diet until your symptoms improved to include soups, broths, yogurt, or applesauce. ?Change her toothbrush after 3 days. ?Follow-up if your symptoms do not improve. ?

## 2021-06-01 NOTE — ED Triage Notes (Signed)
Sore throat on right side since yesterday.  States she notice white spots on throat.   ?

## 2021-06-01 NOTE — ED Provider Notes (Signed)
?[facility] ? ? ? ?CSN: [id number] ?Arrival date & time: 06/01/21  0813 ? ? ?  ? ?History   ?Chief Complaint ?No chief complaint on file. ? ? ?HPI ?Brianna Bridges is a 26 y.o. female.  ? ?The patient is a 26 year old female who presents with sore throat.  Symptoms started 1 day ago.  Patient does report that over the weekend she did not feel well.  She states on yesterday she noticed pain out of her throat.  She also reports that she saw white patches on her throat with red streaks on her tonsil.  She states the right right side of her throat feels worse on the left.  She denies fever, chills, nausea, vomiting, abdominal pain, or headache.  She does report a history of recurrent strep and was told several years ago to have her tonsils removed but she did not.  She has not been taking any medication for her symptoms.  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 ? ?The history is provided by the patient.  ? ?Past Medical History:  ?Diagnosis Date  ? Food intolerance   ? GERD (gastroesophageal reflux disease)   ? Hepatitis A   ? IBS (irritable bowel syndrome)   ? Polycystic ovary syndrome   ? ? ?Patient Active Problem List  ? Diagnosis Date Noted  ? Anal fissure 04/27/2021  ? Rectal bleeding 04/23/2020  ? GERD (gastroesophageal reflux disease) 10/22/2019  ? Anal skin tag 03/04/2019  ? IBS (irritable bowel syndrome)   ? Abdominal pain, chronic, right upper quadrant 01/29/2019  ? Abdominal pain, chronic, right lower quadrant 01/29/2019  ? Calculus of gallbladder without cholecystitis without obstruction 12/04/2018  ? Abnormal LFTs 11/05/2018  ? Chronic diarrhea 11/05/2018  ? Recurrent tonsillitis 05/24/2017  ? ? ?Past Surgical History:  ?Procedure Laterality Date  ? BIOPSY  05/06/2020  ? Procedure: BIOPSY;  Surgeon: Rogene Houston, MD;  Location: [facility];  Service: Endoscopy;;  duodenal, random colon  ? CHOLECYSTECTOMY N/A 01/16/2019  ? Procedure:  LAPAROSCOPIC CHOLECYSTECTOMY;  Surgeon: [name], MD;  Location: [facility];  Service: General;  Laterality: N/A;  ? COLONOSCOPY WITH PROPOFOL N/A 05/06/2020  ? Procedure: COLONOSCOPY WITH PROPOFOL;  Surgeon: Rogene Houston, MD;  Location: [facility];  Service: Endoscopy;  Laterality: N/A;  ? ESOPHAGOGASTRODUODENOSCOPY (EGD) WITH PROPOFOL N/A 05/06/2020  ? Procedure: ESOPHAGOGASTRODUODENOSCOPY (EGD) WITH PROPOFOL;  Surgeon: Rogene Houston, MD;  Location: [facility];  Service: Endoscopy;  Laterality: N/A;  AM  ? EYE SURGERY Bilateral   ? Foot surgery Left   ? Plantars wart  ? LIVER BIOPSY N/A 01/16/2019  ? Procedure: LIVER BIOPSY;  Surgeon: [name], MD;  Location: [facility];  Service: General;  Laterality: N/A;  ? WISDOM TOOTH EXTRACTION    ? ? ?OB History   ?No obstetric history on file. ?  ? ? ? ?Home Medications   ? ?Prior to Admission medications   ?Medication Sig Start Date End Date Taking? Authorizing Provider  ?penicillin v potassium (VEETID) 500 MG tablet Take 1 tablet (500 mg total) by mouth 3 (three) times daily for 7 days. 06/01/21 06/08/21 Yes Jahaziel Francois-Warren, Alda Lea, NP  ?diltiazem 2 % GEL Apply 1 application. topically 2 (two) times daily. 04/21/21   Rogene Houston, MD  ?drospirenone-ethinyl estradiol (YASMIN) 3-0.03 MG tablet Take 1 tablet by mouth daily. 09/04/18   [provider]  ?hydrocortisone cream 1 % Apply  to affected area 2 times daily 04/23/21   Mickie Hillier, PA-C  ?hydroxypropyl methylcellulose / hypromellose (ISOPTO TEARS / GONIOVISC) 2.5 % ophthalmic solution Place 1 drop into both eyes 2 (two) times daily.    [provider]  ?ketoconazole (NIZORAL) 2 % shampoo Apply 1 application topically 2 (two) times a week. 02/20/20   [provider]  ?metroNIDAZOLE (METROCREAM) 0.75 % cream Apply 1 application topically at bedtime. 02/20/20   [provider]  ?OVER THE COUNTER MEDICATION Ultra Nutrient Multivit capsule BID    [provider]  ?pantoprazole (PROTONIX) 40 MG tablet Take 1 tablet (40 mg total) by mouth daily before breakfast. 05/06/20   Rehman, Mechele Dawley, MD  ?tretinoin (RETIN-A) 0.025 % cream Apply 1 application topically at bedtime.  09/05/18   [provider]  ? ? ?Family History ?Family History  ?Problem Relation Age of Onset  ? GER disease Mother   ? Hyperlipidemia Father   ? High Cholesterol Father   ? Healthy Sister   ? ? ?Social History ?Social History  ? ?Tobacco Use  ? Smoking status: Never  ?  Passive exposure: Never  ? Smokeless tobacco: Never  ?Vaping Use  ? Vaping Use: Never used  ?Substance Use Topics  ? Alcohol use: Not Currently  ? Drug use: No  ? ? ? ?Allergies   ?Oxycodone ? ? ?Review of Systems ?Review of Systems  ?Constitutional:  Positive for activity change. Negative for appetite change and fever.  ?HENT:  Positive for sore throat and trouble swallowing. Negative for congestion.   ?Eyes: Negative.   ?Respiratory: Negative.    ?Gastrointestinal: Negative.   ?Skin: Negative.   ?Psychiatric/Behavioral: Negative.    ? ? ?Physical Exam ?Triage Vital Signs ?ED Triage Vitals  ?Enc Vitals Group  ?   BP 06/01/21 0829 (!) 150/97  ?   Pulse Rate 06/01/21 0829 88  ?   Resp 06/01/21 0829 18  ?   Temp 06/01/21 0829 98.4 ?F (36.9 ?C)  ?   Temp Source 06/01/21 0829 Oral  ?   SpO2 06/01/21 0829 98 %  ?   Weight --   ?   Height --   ?   Head Circumference --   ?   Peak Flow --   ?   Pain Score 06/01/21 0830 4  ?   Pain Loc --   ?   Pain Edu? --   ?   Excl. in [place]? --   ? ?No data found. ? ?Updated Vital Signs ?BP (!) 150/97 (BP Location: Right Arm)   Pulse 88   Temp 98.4 ?F (36.9 ?C) (Oral)   Resp 18   LMP 05/04/2021 (Exact Date)   SpO2 98%  ? ?Visual Acuity ?Right Eye Distance:   ?Left Eye Distance:   ?Bilateral Distance:   ? ?Right Eye Near:   ?Left Eye Near:    ?Bilateral Near:    ? ?Physical Exam ?Vitals reviewed.  ?Constitutional:   ?   Appearance: Normal appearance.  ?HENT:  ?   Head: Normocephalic.  ?    Right Ear: Tympanic membrane, ear canal and external ear normal.  ?   Left Ear: Tympanic membrane, ear canal and external ear normal.  ?   Nose: Nose normal.  ?   Mouth/Throat:  ?   Mouth: Mucous membranes are moist.  ?   Pharynx: Uvula midline. Oropharyngeal exudate and posterior oropharyngeal erythema present.  ?   Tonsils: Tonsillar exudate present. No tonsillar  abscesses. 1+ on the right. 1+ on the left.  ?Eyes:  ?   Extraocular Movements: Extraocular movements intact.  ?   Conjunctiva/sclera: Conjunctivae normal.  ?   Pupils: Pupils are equal, round, and reactive to light.  ?Cardiovascular:  ?   Rate and Rhythm: Normal rate and regular rhythm.  ?   Pulses: Normal pulses.  ?   Heart sounds: Normal heart sounds.  ?Pulmonary:  ?   Effort: Pulmonary effort is normal.  ?   Breath sounds: Normal breath sounds.  ?Abdominal:  ?   General: Bowel sounds are normal.  ?   Palpations: Abdomen is soft.  ?   Tenderness: There is no abdominal tenderness.  ?Musculoskeletal:  ?   Cervical back: Normal range of motion.  ?Lymphadenopathy:  ?   Cervical: Cervical adenopathy (right) present.  ?Skin: ?   General: Skin is warm and dry.  ?Neurological:  ?   General: No focal deficit present.  ?   Mental Status: She is alert and oriented to person, place, and time.  ?Psychiatric:     ?   Mood and Affect: Mood normal.     ?   Behavior: Behavior normal.  ? ? ? ?UC Treatments / Results  ?Labs ?(all labs ordered are listed, but only abnormal results are displayed) ?Labs Reviewed  ?POCT RAPID STREP A (OFFICE) - Abnormal; Notable for the following components:  ?    Result Value  ? Rapid Strep A Screen Positive (*)   ? All other components within normal limits  ? ? ?EKG ? ? ?Radiology ?No results found. ? ?Procedures ?Procedures (including critical care time) ? ?Medications Ordered in UC ?Medications - No data to display ? ?Initial Impression / Assessment and Plan / UC Course  ?I have reviewed the triage vital signs and the nursing  notes. ? ?Pertinent labs & imaging results that were available during my care of the patient were reviewed by me and considered in my medical decision making (see chart for details). ? ?The patient is a 26 year old female who presents

## 2021-08-25 ENCOUNTER — Encounter: Payer: Self-pay | Admitting: Family Medicine

## 2021-08-25 ENCOUNTER — Ambulatory Visit: Payer: 59 | Admitting: Family Medicine

## 2021-08-25 ENCOUNTER — Ambulatory Visit (INDEPENDENT_AMBULATORY_CARE_PROVIDER_SITE_OTHER): Payer: 59

## 2021-08-25 VITALS — BP 123/86 | HR 76 | Temp 97.2°F | Ht 68.0 in | Wt 158.4 lb

## 2021-08-25 DIAGNOSIS — M79644 Pain in right finger(s): Secondary | ICD-10-CM | POA: Diagnosis not present

## 2021-08-25 DIAGNOSIS — Z8269 Family history of other diseases of the musculoskeletal system and connective tissue: Secondary | ICD-10-CM | POA: Diagnosis not present

## 2021-08-25 DIAGNOSIS — M7989 Other specified soft tissue disorders: Secondary | ICD-10-CM

## 2021-08-25 MED ORDER — DICLOFENAC SODIUM 75 MG PO TBEC: 75.0000 mg | DELAYED_RELEASE_TABLET | Freq: Two times a day (BID) | ORAL | 2 refills | Status: DC | PRN

## 2021-08-25 NOTE — Progress Notes (Signed)
Subjective: CC: Right pinky pain PCP: Brianna Norlander, DO [id number] Brianna Bridges is a 26 y.o. female presenting to clinic today for:  1.  Right pinky pain and swelling Patient reports that she started having some pain along the base of the joint of the right pinky about 2 months ago.  Denies any preceding injury.  She has not really appreciated any swelling, heat or redness.  The pain seems to be worse in the morning and then gets somewhat better throughout the day but really does not resolve.  She is almost unable to move that pinky finger at all in the morning but she does gradually get a little bit more range of motion throughout the day.  She utilized an arthritis glove of her grandmothers which did help.  She used it the day before yesterday but did not use it yesterday because she wanted to see if this was getting better on its own or if the glove really helped.  She has not had any medications for this including OTCs.  The pain is described as dull and she can now barely hold a toothbrush due to the discomfort and loss of range of motion.  She is right-hand dominant.  Family history significant for rheumatoid arthritis in her maternal grandmother and juvenile rheumatoid arthritis in her paternal first cousin.  She does have a history of GI bleeding but this was from the lower GI and she has never had any history of ulceration of the gut.  She has Protonix on hand for as needed use but does not use this regularly.   ROS: Per HPI  Allergies  Allergen Reactions   Oxycodone Itching   Past Medical History:  Diagnosis Date   Abdominal pain, chronic, right lower quadrant 01/29/2019   Abnormal LFTs 11/05/2018   Food intolerance    GERD (gastroesophageal reflux disease)    Hepatitis A    IBS (irritable bowel syndrome)    Polycystic ovary syndrome    Rectal bleeding 04/23/2020   Recurrent tonsillitis 05/24/2017    Current Outpatient Medications:    clindamycin (CLINDAGEL) 1 % gel, Apply  topically 2 (two) times daily., Disp: , Rfl:    drospirenone-ethinyl estradiol (YASMIN) 3-0.03 MG tablet, Take 1 tablet by mouth daily., Disp: , Rfl:    ketoconazole (NIZORAL) 2 % shampoo, Apply 1 application topically 2 (two) times a week., Disp: , Rfl:    metroNIDAZOLE (METROCREAM) 0.75 % cream, Apply 1 application topically at bedtime., Disp: , Rfl:    OVER THE COUNTER MEDICATION, Ultra Nutrient Multivit capsule BID, Disp: , Rfl:    pantoprazole (PROTONIX) 40 MG tablet, Take 1 tablet (40 mg total) by mouth daily before breakfast., Disp: 30 tablet, Rfl: 5   tretinoin (RETIN-A) 0.025 % cream, Apply 1 application topically at bedtime. , Disp: , Rfl:    diltiazem 2 % GEL, Apply 1 application. topically 2 (two) times daily. (Patient not taking: Reported on 08/25/2021), Disp: 30 g, Rfl: 2   hydrocortisone cream 1 %, Apply to affected area 2 times daily (Patient not taking: Reported on 08/25/2021), Disp: 15 g, Rfl: 0   hydroxypropyl methylcellulose / hypromellose (ISOPTO TEARS / GONIOVISC) 2.5 % ophthalmic solution, Place 1 drop into both eyes 2 (two) times daily. (Patient not taking: Reported on 08/25/2021), Disp: , Rfl:  Social History   Socioeconomic History   Marital status: Single    Spouse name: Not on file   Number of children: Not on file   Years of education:  Not on file   Highest education level: Not on file  Occupational History   Not on file  Tobacco Use   Smoking status: Never    Passive exposure: Never   Smokeless tobacco: Never  Vaping Use   Vaping Use: Never used  Substance and Sexual Activity   Alcohol use: Not Currently   Drug use: No   Sexual activity: Yes    Birth control/protection: Pill  Other Topics Concern   Not on file  Social History Narrative   Not on file   Social Determinants of Health   Financial Resource Strain: Not on file  Food Insecurity: Not on file  Transportation Needs: Not on file  Physical Activity: Not on file  Stress: Not on file  Social  Connections: Not on file  Intimate Partner Violence: Not on file   Family History  Problem Relation Age of Onset   GER disease Mother    Hyperlipidemia Father    High Cholesterol Father    Healthy Sister     Objective: Office vital signs reviewed. BP 123/86   Pulse 76   Temp (!) 97.2 F (36.2 C)   Ht 5' 8"  (1.727 m)   Wt 158 lb 6.4 oz (71.8 kg)   SpO2 96%   BMI 24.08 kg/m   Physical Examination:  General: Awake, alert, well nourished, No acute distress Extremities: warm, well perfused, No edema, cyanosis or clubbing; +2 pulses bilaterally MSK:  Right pinky: She has very limited active range of motion of that right pinky.  No gross swelling, deformity, warmth or heat appreciated.  She has decreased strength in flexion of the pinky but strength is intact in extension.  She does have tenderness to palpation along the MCP of that fifth digit.  DG Finger Little Right  Result Date: 08/25/2021 CLINICAL DATA:  Pain and swelling of the right fifth digit for 2 months. No injury. EXAM: RIGHT LITTLE FINGER 2+V COMPARISON:  None Available. FINDINGS: There is no evidence of fracture or dislocation. There is no evidence of arthropathy or other focal bone abnormality. Soft tissues are unremarkable. IMPRESSION: Negative. Electronically Signed   By: Abelardo Diesel M.Brianna.   On: 08/25/2021 14:37     Assessment/ Plan: 26 y.o. female   Swelling of little finger - Plan: DG Finger Little Right, Rheumatoid factor, ANA w/Reflex if Positive, Uric acid, CBC, CMP14+EGFR, Sedimentation Rate, C-reactive protein, diclofenac (VOLTAREN) 75 MG EC tablet  Finger pain, right - Plan: DG Finger Little Right, Rheumatoid factor, ANA w/Reflex if Positive, Uric acid, CBC, CMP14+EGFR, Sedimentation Rate, C-reactive protein, diclofenac (VOLTAREN) 75 MG EC tablet  Family history of rheumatic joint disease - Plan: DG Finger Little Right, Rheumatoid factor, ANA w/Reflex if Positive, Uric acid, CBC, CMP14+EGFR, Sedimentation  Rate, C-reactive protein, diclofenac (VOLTAREN) 75 MG EC tablet  Uncertain etiology but will evaluate for possible rheumatologic etiology.  Labs were obtained today.  Plain films demonstrated no erosive changes or other arthritic changes.  We discussed that if her labs were unrevealing I recommend referral to sports medicine center in [place] or along droppage [place] to directly visualize the joint and tendons under ultrasound.  She was amenable to this plan and referral will be placed pending the results of tomorrow's labs.  In the meantime, I have sent an oral NSAID to take twice daily.  Avoid other OTC NSAIDs but may use Tylenol if needed.  Okay to continue glove if this is helpful.  Advised to use her PPI while she is on  the NSAID.  Discussed indications for discontinuation.  No orders of the defined types were placed in this encounter.  No orders of the defined types were placed in this encounter.    Brianna Norlander, DO [place] [phone]

## 2021-08-27 ENCOUNTER — Other Ambulatory Visit: Payer: Self-pay | Admitting: Family Medicine

## 2021-08-27 DIAGNOSIS — M7989 Other specified soft tissue disorders: Secondary | ICD-10-CM

## 2021-08-27 DIAGNOSIS — M79644 Pain in right finger(s): Secondary | ICD-10-CM

## 2021-08-28 LAB — CMP14+EGFR
ALT: 17 IU/L (ref 0–32)
AST: 18 IU/L (ref 0–40)
Albumin/Globulin Ratio: 2 (ref 1.2–2.2)
Albumin: 4.7 g/dL (ref 4.0–5.0)
Alkaline Phosphatase: 38 IU/L — ABNORMAL LOW (ref 44–121)
BUN/Creatinine Ratio: 16 (ref 9–23)
BUN: 14 mg/dL (ref 6–20)
Bilirubin Total: 0.5 mg/dL (ref 0.0–1.2)
CO2: 23 mmol/L (ref 20–29)
Calcium: 9.5 mg/dL (ref 8.7–10.2)
Chloride: 102 mmol/L (ref 96–106)
Creatinine, Ser: 0.88 mg/dL (ref 0.57–1.00)
Globulin, Total: 2.4 g/dL (ref 1.5–4.5)
Glucose: 92 mg/dL (ref 70–99)
Potassium: 4.5 mmol/L (ref 3.5–5.2)
Sodium: 140 mmol/L (ref 134–144)
Total Protein: 7.1 g/dL (ref 6.0–8.5)
eGFR: 93 mL/min/{1.73_m2} (ref >59)

## 2021-08-28 LAB — CBC
Hematocrit: 43.9 % (ref 34.0–46.6)
Hemoglobin: 15 g/dL (ref 11.1–15.9)
MCH: 29.6 pg (ref 26.6–33.0)
MCHC: 34.2 g/dL (ref 31.5–35.7)
MCV: 87 fL (ref 79–97)
Platelets: 280 10*3/uL (ref 150–450)
RBC: 5.07 x10E6/uL (ref 3.77–5.28)
RDW: 12.4 % (ref 11.7–15.4)
WBC: 4.7 10*3/uL (ref 3.4–10.8)

## 2021-08-28 LAB — RHEUMATOID FACTOR: Rheumatoid fact SerPl-aCnc: <10 IU/mL (ref <14.0)

## 2021-08-28 LAB — SEDIMENTATION RATE: Sed Rate: 2 mm/hr (ref 0–32)

## 2021-08-28 LAB — URIC ACID: Uric Acid: 5.4 mg/dL (ref 2.6–6.2)

## 2021-08-28 LAB — ANA W/REFLEX IF POSITIVE: Anti Nuclear Antibody (ANA): NEGATIVE

## 2021-08-28 LAB — C-REACTIVE PROTEIN: CRP: 1 mg/L (ref 0–10)

## 2021-09-03 ENCOUNTER — Encounter: Payer: Self-pay | Admitting: Family Medicine

## 2021-09-03 ENCOUNTER — Ambulatory Visit: Payer: 59 | Admitting: Family Medicine

## 2021-09-03 ENCOUNTER — Ambulatory Visit: Payer: Self-pay

## 2021-09-03 VITALS — BP 130/90 | Ht 67.5 in | Wt 158.0 lb

## 2021-09-03 DIAGNOSIS — M79644 Pain in right finger(s): Secondary | ICD-10-CM

## 2021-09-03 NOTE — Patient Instructions (Signed)
The ultrasound of your right fifth finger shows synovial hypertrophy within the MCP joint.  It also shows a small amount of fluid in the flexor tendon of that little finger.  This certainly explains your pain.    This could be early arthritis.  The fact that it is only located in one joint makes me suspect it could also be posttraumatic  arthritis vs rheumatoid arthritis.    I am setting up to see a hand surgeon.  Depending on their opinion, we may or may not need to get you into see a rheumatologist.    I think what you are doing with the arthritis glove is perfect.  I would also recommend trying some of the over-the-counter topical creams such as Voltaren gel, Aspercreme, Bengay, etc.  This may be symptomatically helpful.     After you see the hand surgeon, please either follow-up by phone with me or Dr. Lajuana Ripple.  Please feel free to call in the interim with any questions or concerns.  It was great to meet you!

## 2021-09-03 NOTE — Progress Notes (Signed)
  Brianna Bridges - 26 y.o. female MRN [id number]  Date of birth: 1995/11/17    SUBJECTIVE:      Chief Complaint:/ HPI:  Right fifth finger pain for about the last 2 months.  It has also been swelling.  In the last few weeks the pain is gotten so bad that it is difficult for her to straighten the finger at all.  She has tried over-the-counter remedies without much success and she is now wearing an arthritis glove which offer some compression and seems to help a little bit.  The pain is so bad the morning when she wakes up that she can barely straighten the pinky finger at all.  As the day goes on, she gets some improvement but it never returns to normal.  She has had no known injury to this finger.   PERTINENT  PMH / PSH: I have reviewed the patient's medications, allergies, past medical and surgical history, smoking status.  Pertinent findings that relate to today's visit / issues include: Her grandmother and her cousin both have rheumatoid arthritis.  Her cousin had it as a juvenile and her grandmother got arthritis in an older age.   OBJECTIVE: BP 130/90 (BP Location: Right Arm, Patient Position: Sitting)   Ht 5' 7.5" (1.715 m)   Wt 158 lb (71.7 kg)   BMI 24.38 kg/m   Physical Exam:  Vital signs are reviewed. GENERAL: Well-developed female no acute distress HANDS: Symmetrical. FINGER: Right fifth.  Slightly swollen and tender to palpation over the MCP and over the Lexer and extensor surfaces.  She has intact sensation.  Cap refill is normal. ULTRASOUND: Ultrasound reveals fluid in the tendon sheath of both the flexor and extensor tendons.  The MCP reveals an effusion with some boggy synovial hypertrophy that was significantly increased in size compared with her contralateral hand.  ASSESSMENT & PLAN:  See problem based charting & AVS for pt instructions. No problem-specific Assessment & Plan notes found for this encounter.

## 2021-09-03 NOTE — Assessment & Plan Note (Signed)
4-day weeks of increasing pain in the right fifth finger, now having significant limited use of the finger secondary to pain and stiffness.  Ultrasound concerning for fluid in the tendon sheath as well as synovial hypertrophy via of the fifth MCP joint.  We will refer her to hand surgery.

## 2021-09-07 ENCOUNTER — Encounter: Payer: Self-pay | Admitting: Orthopedic Surgery

## 2021-09-07 ENCOUNTER — Ambulatory Visit: Payer: 59 | Admitting: Orthopedic Surgery

## 2021-09-07 VITALS — Ht 67.5 in | Wt 158.0 lb

## 2021-09-07 DIAGNOSIS — M79644 Pain in right finger(s): Secondary | ICD-10-CM | POA: Diagnosis not present

## 2021-09-07 NOTE — Progress Notes (Signed)
Office Visit Note   Patient: Brianna Bridges           Date of Birth: [birth year]           MRN: [id number] Visit Date: 09/07/2021              Requested by: Dickie La, MD [address] [place],  [place] [address] PCP: Janora Norlander, DO   Assessment & Plan: Visit Diagnoses:  1. Finger pain, right     Plan: We reviewed patient's previous x-ray which does not demonstrate any significant arthritis of the small finger MCP joint.  There is no acute bony injury.  She does not have any appreciable synovitis or swelling at the MCP joint.  There is no palpable triggering.  We discussed trying an oral anti-inflammatory medication as she has not tried anything for this yet aside from arthritis brace.  We discussed use of a PPI to prevent GI side effects.  I can see her back again as needed.  Follow-Up Instructions: No follow-ups on file.   Orders:  No orders of the defined types were placed in this encounter.  No orders of the defined types were placed in this encounter.     Procedures: No procedures performed   Clinical Data: No additional findings.   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  Is been going about 3 months now.  She denies any injury to the hand.  Pain is localized to the right small finger MCP joint.  Her symptoms are worse first thing in the morning when she wakes up and her finger is stiff and painful.  Her range of motion seems to improve throughout the day.  She notes that she has decreased grip strength in this hand since this episode started.  She denies any nocturnal symptoms.  She denies any locking or catching of the finger.  She denies any subjective instability of the finger.  She has not taken any medication for this  issue.  She has worn an arthritis compression type glove which she thinks helps some.  She was given a prescription for likely diclofenac which she has not started yet.  She does have a history of arthritis in her family with her mom with osteoarthritis, a grandmother with rheumatoid arthritis, and a cousin with juvenile rheumatoid arthritis.    Review of Systems   Objective: Vital Signs: Ht 5' 7.5" (1.715 m)   Wt 158 lb (71.7 kg)   BMI 24.38 kg/m   Physical Exam Constitutional:      Appearance: Normal appearance.  Cardiovascular:     Rate and Rhythm: Normal rate.     Pulses: Normal pulses.  Pulmonary:     Effort: Pulmonary effort is normal.  Skin:    General: Skin is warm and dry.     Capillary Refill: Capillary refill takes less than 2 seconds.  Neurological:     Mental Status: She is alert.     Right Hand Exam   Tenderness  Right hand tenderness location: Minimal TTP at small finger MCP joint.  Other  Erythema: absent Sensation: normal Pulse: present  Comments:  No obvious swelling at MCPJ w/ no obvious synovitis or inflammation.  No instability at MCPJ.  No TTP at A1 pulley, no nodule, and no palpable triggering or catching.  No extensor tendon instability.       Specialty Comments:  No specialty comments available.  Imaging: No results found.   PMFS History: Patient Active Problem List   Diagnosis Date Noted   Finger pain, right 09/03/2021   Anal fissure 04/27/2021   GERD (gastroesophageal reflux disease) 10/22/2019   Anal skin tag 03/04/2019   IBS (irritable bowel syndrome)    Calculus of gallbladder without cholecystitis without obstruction 12/04/2018   Past Medical History:  Diagnosis Date   Abdominal pain, chronic, right lower quadrant 01/29/2019   Abnormal LFTs 11/05/2018   Food intolerance    GERD (gastroesophageal reflux disease)    Hepatitis A    IBS (irritable bowel syndrome)    Polycystic ovary syndrome    Rectal bleeding 04/23/2020    Recurrent tonsillitis 05/24/2017    Family History  Problem Relation Age of Onset   GER disease Mother    Hyperlipidemia Father    High Cholesterol Father    Healthy Sister     Past Surgical History:  Procedure Laterality Date   BIOPSY  05/06/2020   Procedure: BIOPSY;  Surgeon: Rogene Houston, MD;  [facility];  Service: Endoscopy;;  duodenal, random colon   CHOLECYSTECTOMY N/A 01/16/2019   Procedure: LAPAROSCOPIC CHOLECYSTECTOMY;  Surgeon: [name], MD;  Location: [facility];  Service: General;  Laterality: N/A;   COLONOSCOPY WITH PROPOFOL N/A 05/06/2020   Procedure: COLONOSCOPY WITH PROPOFOL;  Surgeon: Rogene Houston, MD;  [facility];  Service: Endoscopy;  Laterality: N/A;   ESOPHAGOGASTRODUODENOSCOPY (EGD) WITH PROPOFOL N/A 05/06/2020   Procedure: ESOPHAGOGASTRODUODENOSCOPY (EGD) WITH PROPOFOL;  Surgeon: Rogene Houston, MD;  [facility];  Service: Endoscopy;  Laterality: N/A;  AM   EYE SURGERY Bilateral    Foot surgery Left    Plantars wart   LIVER BIOPSY N/A 01/16/2019   Procedure: LIVER BIOPSY;  Surgeon: [name], MD;  Location: [facility];  Service: General;  Laterality: N/A;   WISDOM TOOTH EXTRACTION     Social History   Occupational History   Not on file  Tobacco Use   Smoking status: Never    Passive exposure: Never   Smokeless tobacco: Never  Vaping Use   Vaping Use: Never used  Substance and Sexual Activity   Alcohol use: Not Currently   Drug use: No   Sexual activity: Yes    Birth control/protection: Pill

## 2021-12-18 IMAGING — DX DG CHEST 2V
2 series · 2 of 2 positions shown · non-contrast
Comparison: None.

CLINICAL DATA: Left-sided chest pain.

EXAM:
CHEST - 2 VIEW

[chest pa]
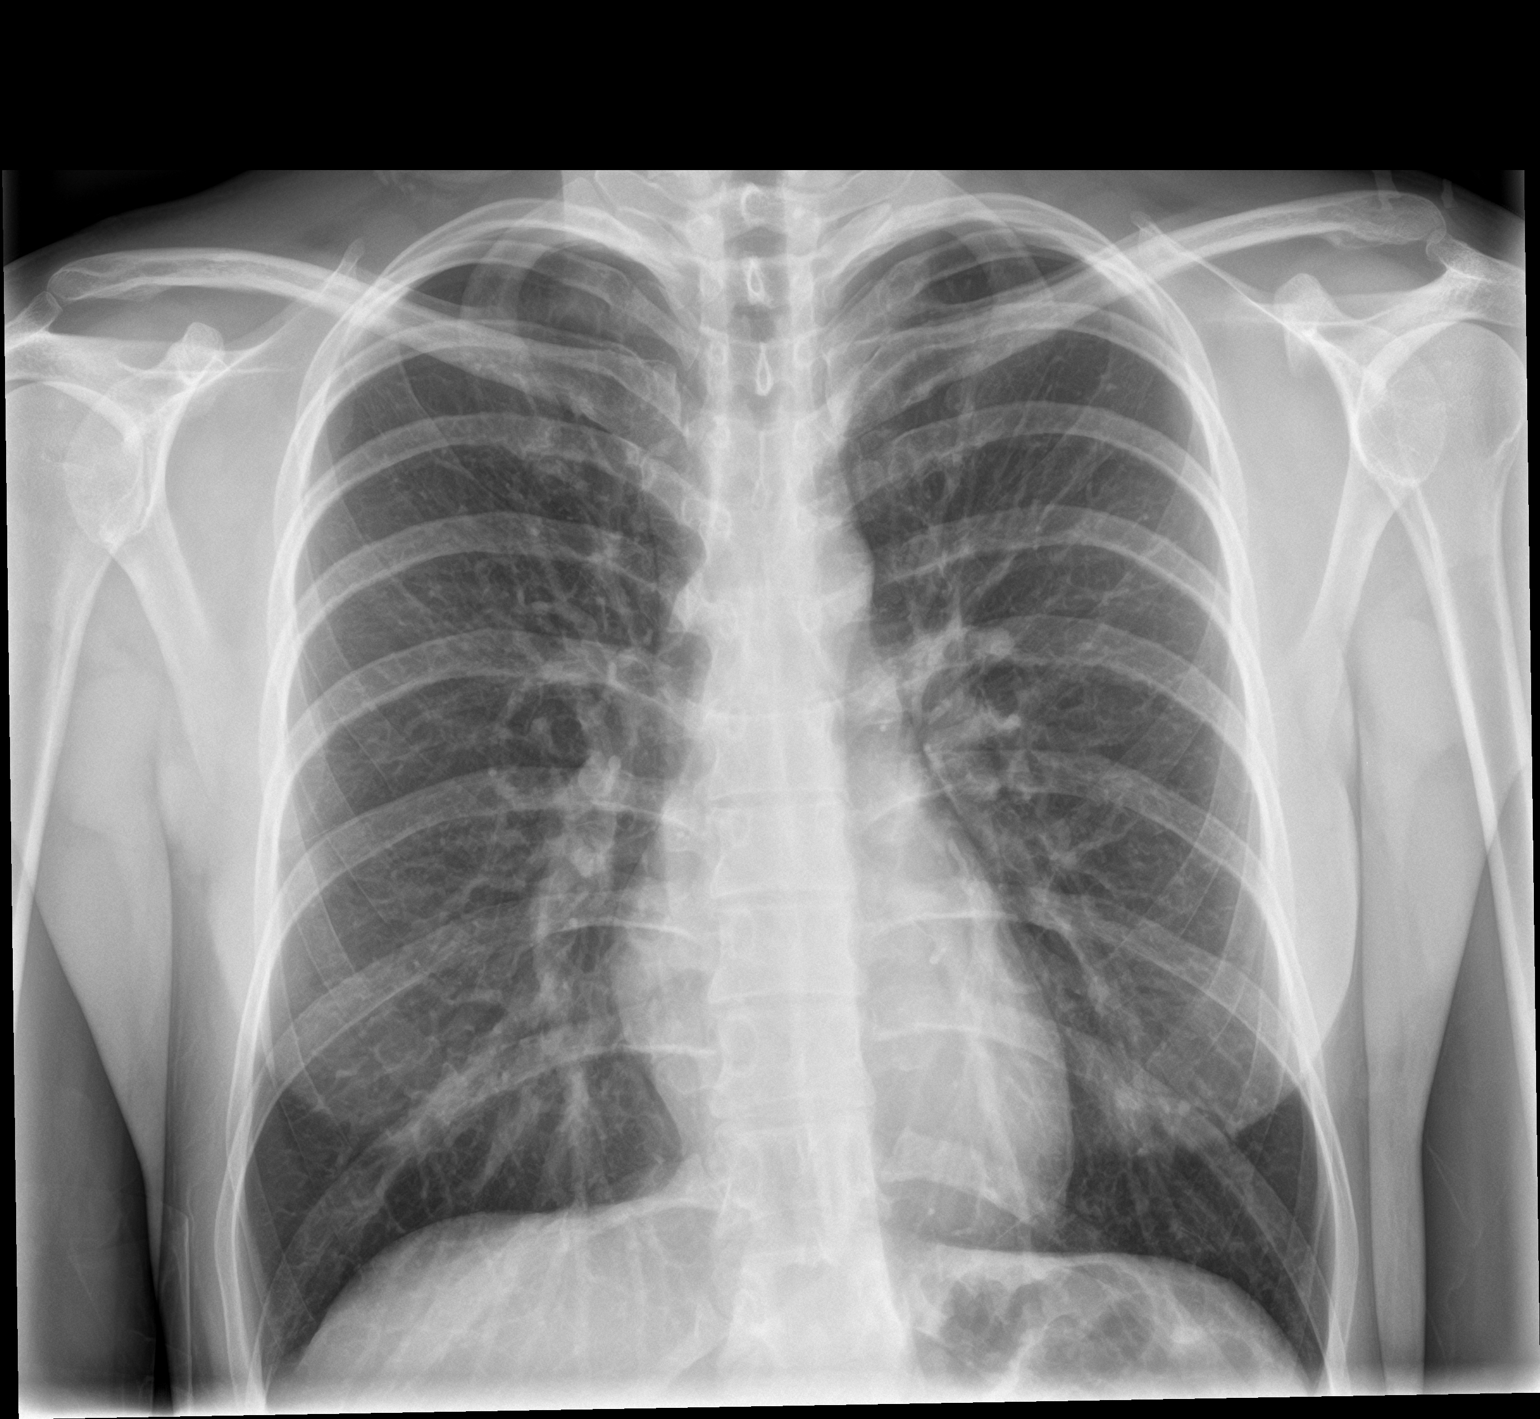

[chest lat]
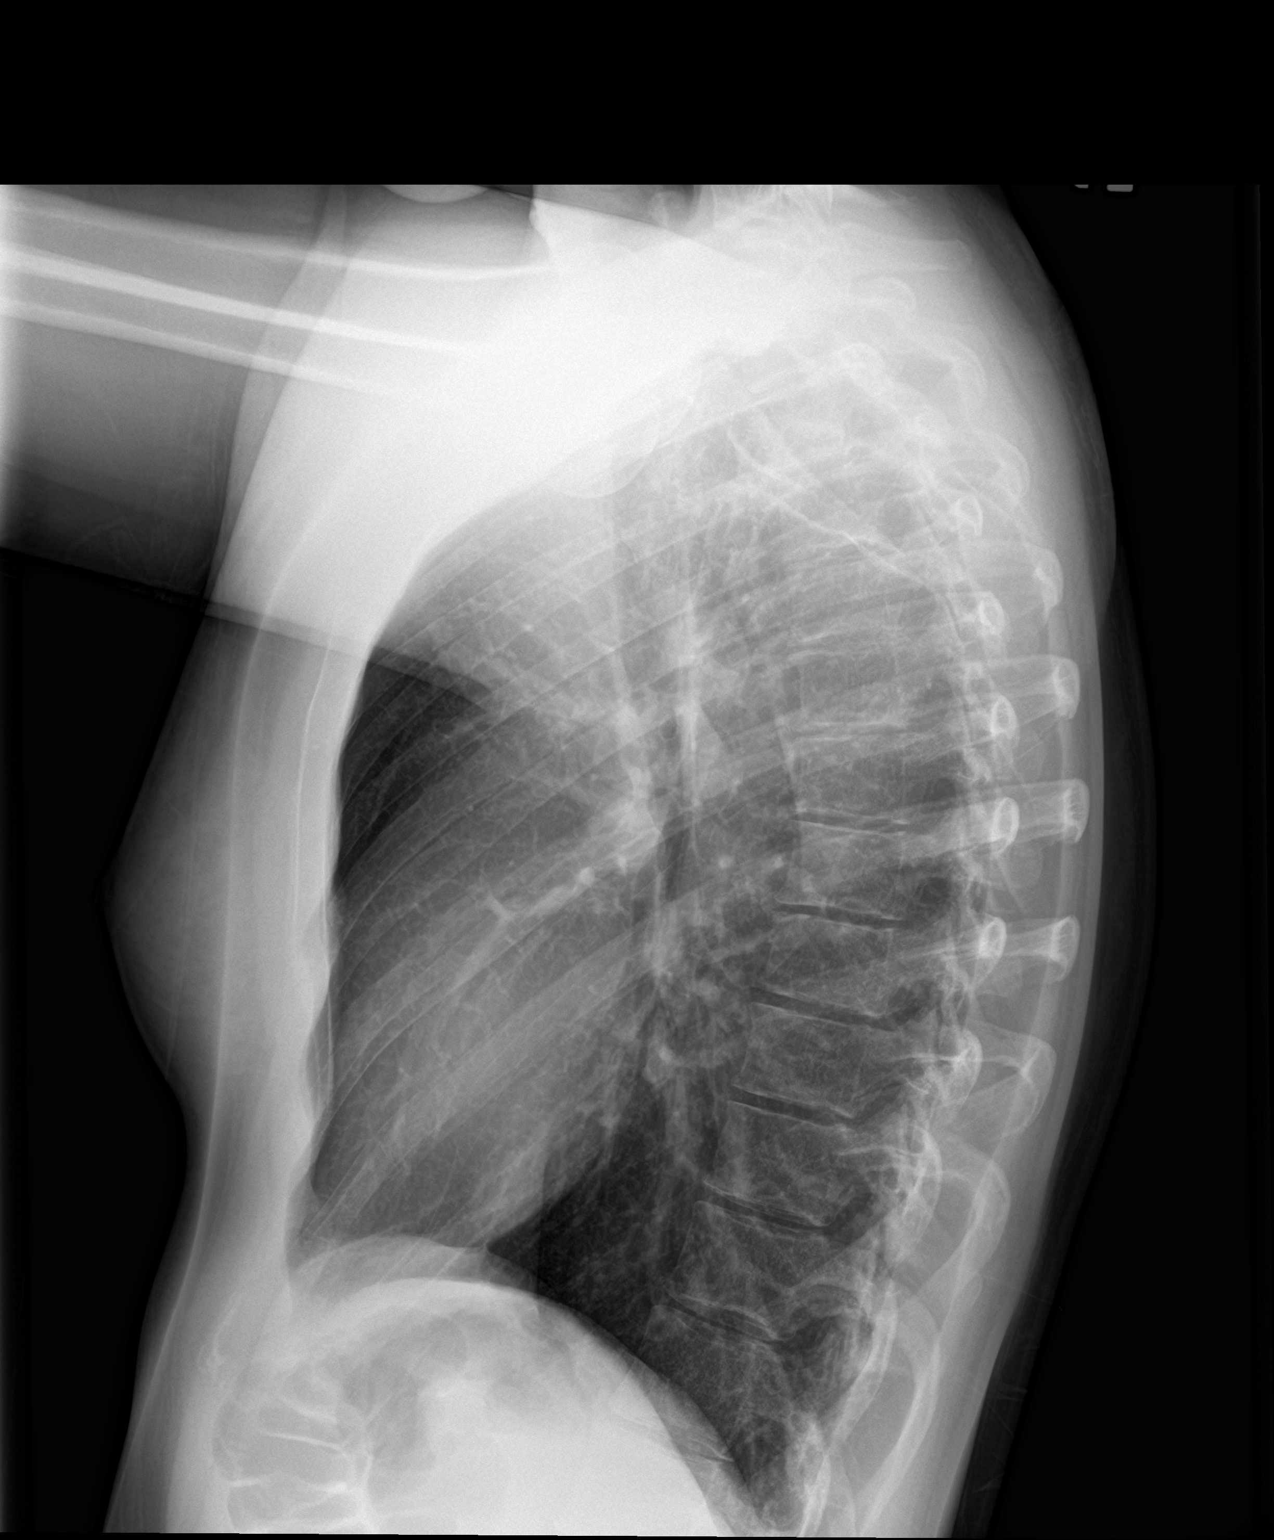

[2 of 2 positions shown; findings below may reference images not displayed]

FINDINGS: There is no apparent cluster of nodules projecting over the left
lower lung zone. There is no focal infiltrate or large pleural
effusion. No pneumothorax. The heart size is normal. There is no
acute osseous abnormality.
IMPRESSION: No active cardiopulmonary disease.

## 2021-12-18 IMAGING — CT CT ANGIO CHEST
2 of 7 series · 15 of 36 positions shown · IV contrast (omnipaque)
Comparison: None.

CLINICAL DATA: Shortness of breath and left-sided chest pain.

EXAM:
CT ANGIOGRAPHY CHEST WITH CONTRAST
TECHNIQUE: Multidetector CT imaging of the chest was performed using the
standard protocol during bolus administration of intravenous
contrast. Multiplanar CT image reconstructions and MIPs were
obtained to evaluate the vascular anatomy.
CONTRAST:  100mL OMNIPAQUE IOHEXOL 350 MG/ML SOLN

[Series 5: pe axial thins · axial · 0.56mm/px · z∈[+1291,+1538]mm · 14 of 286 slices shown]
[im 20/286  lung]
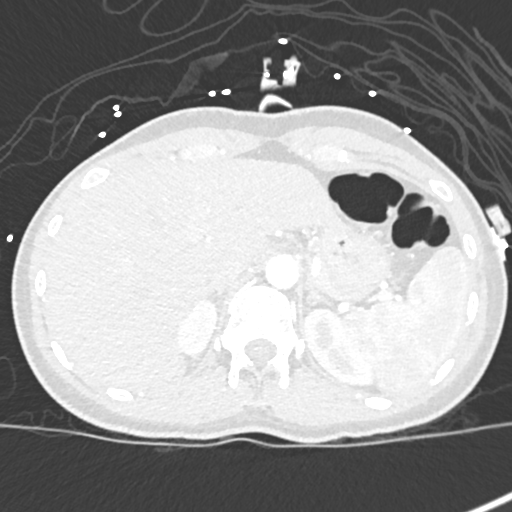
[im 39/286  mediastinal]
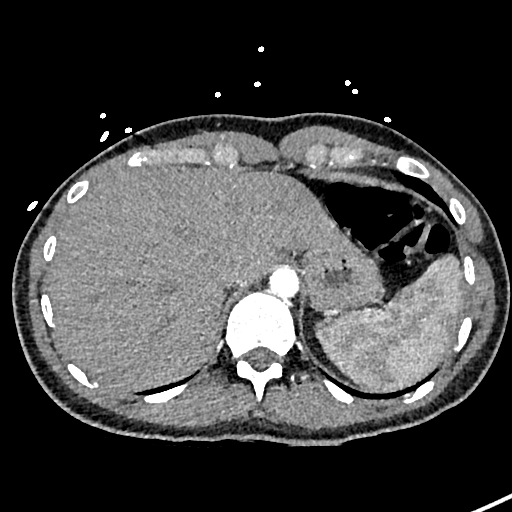
[im 58/286  lung]
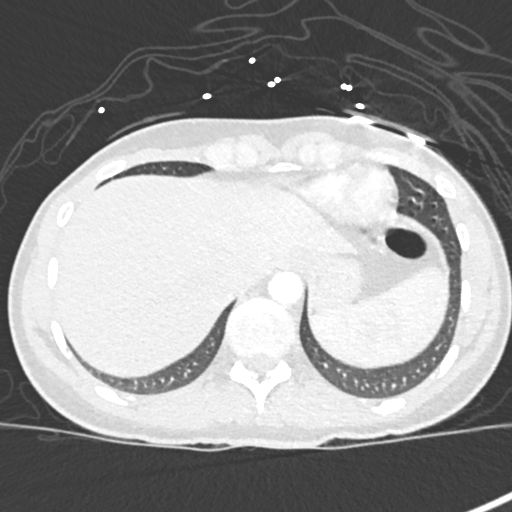
[im 77/286  mediastinal]
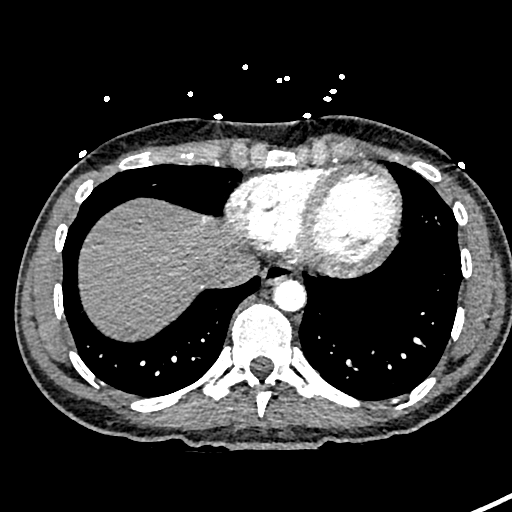
[im 96/286  lung]
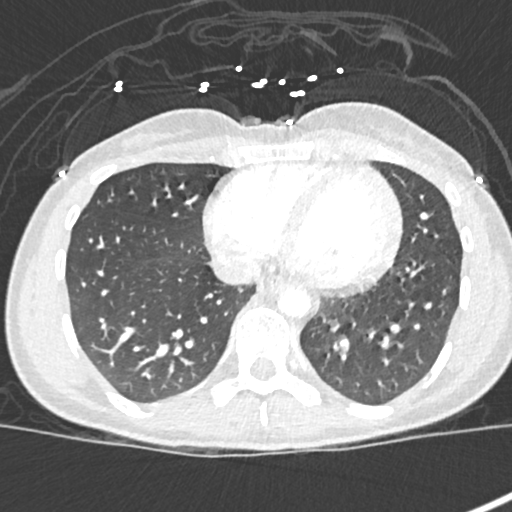
[im 115/286  mediastinal]
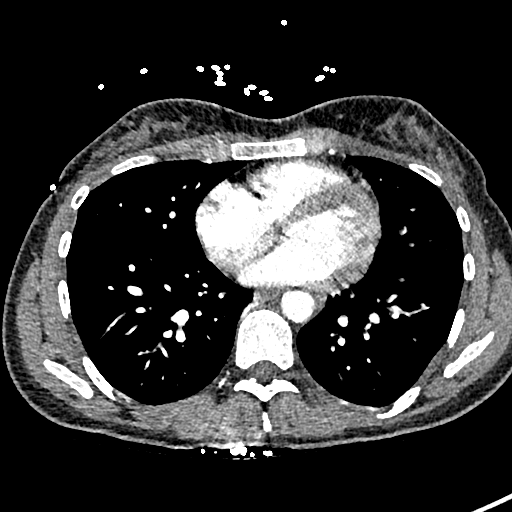
[im 134/286  lung]
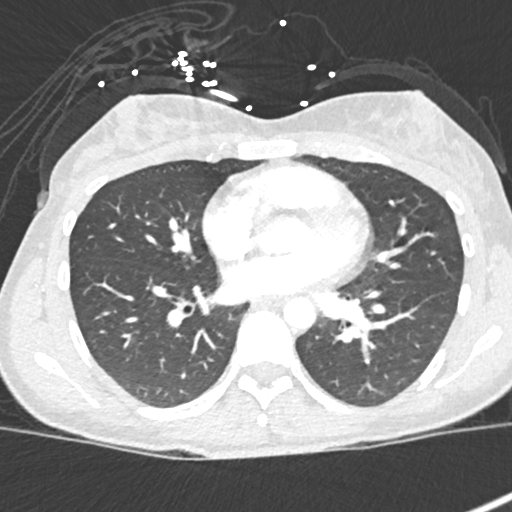
[im 153/286  mediastinal]
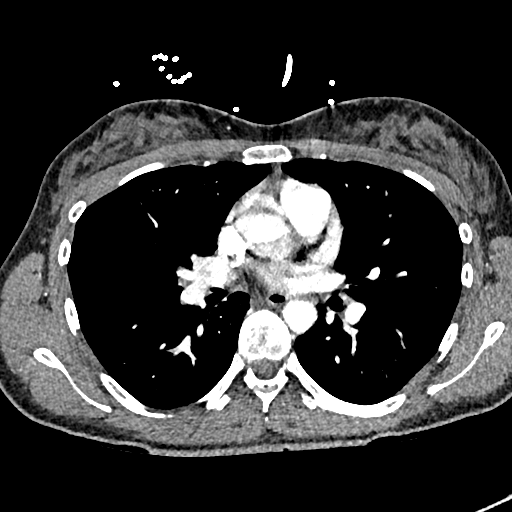
[im 172/286  lung]
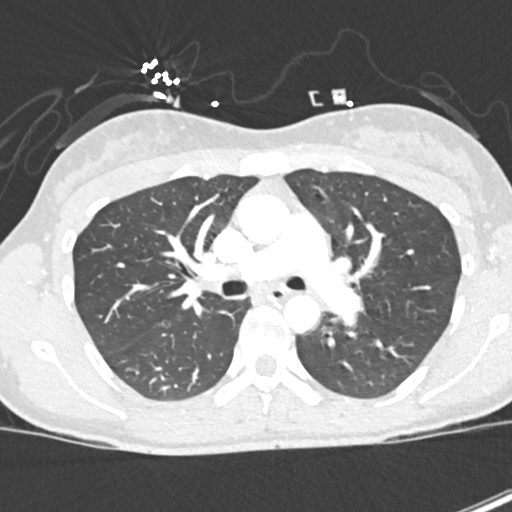
[im 191/286  mediastinal]
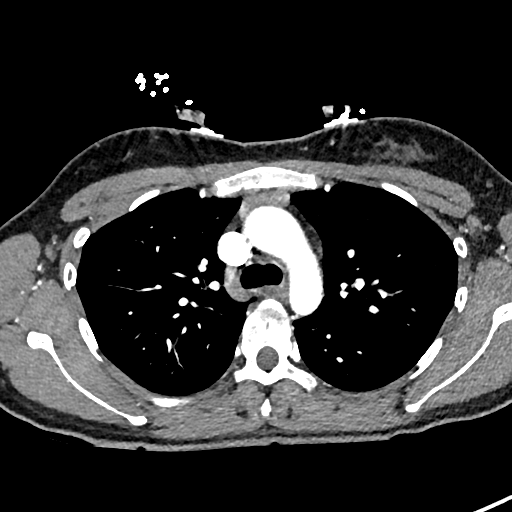
[im 210/286  lung]
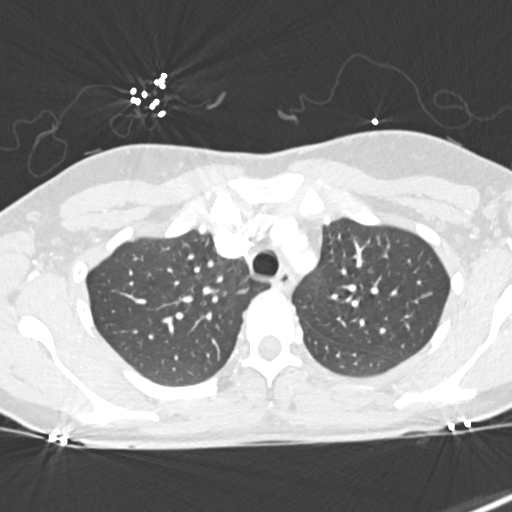
[im 229/286  mediastinal]
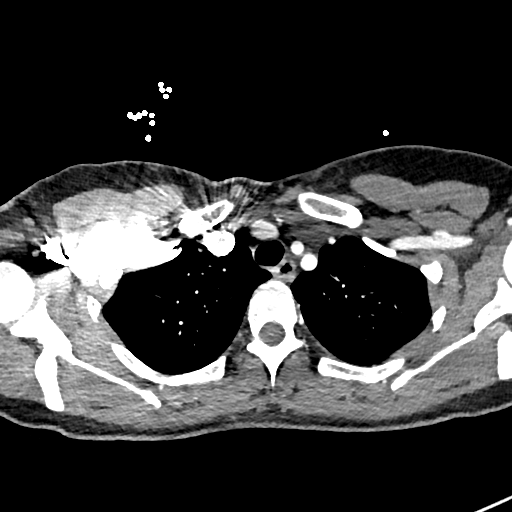
[im 248/286  lung]
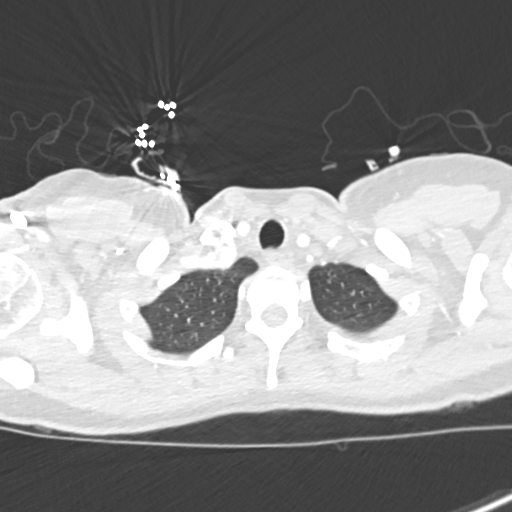
[im 267/286  mediastinal]
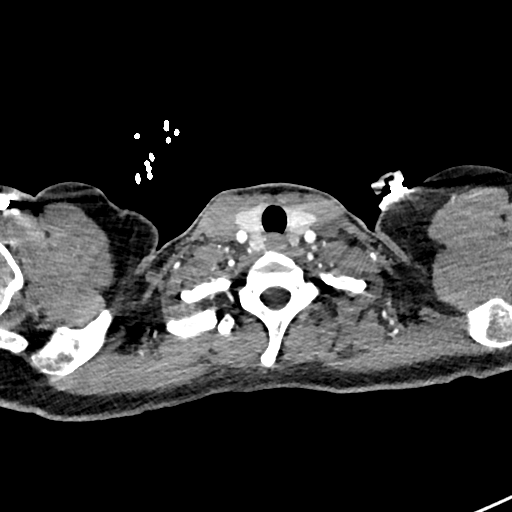

[Series 7: cor soft · coronal · 0.55mm/px · 1 of 103 slices shown]
[im 52/103  mediastinal]
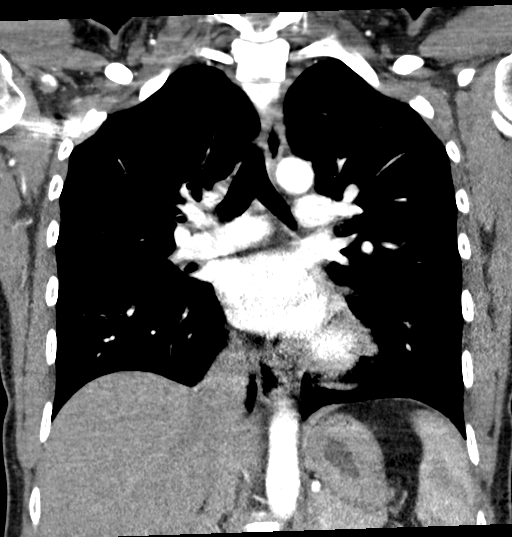

[15 of 36 positions shown; findings below may reference images not displayed]

FINDINGS: Cardiovascular: Satisfactory opacification of the pulmonary arteries
to the segmental level. No evidence of pulmonary embolism. Normal
heart size. No pericardial effusion.

Mediastinum/Nodes: No enlarged mediastinal, hilar, or axillary lymph
nodes. Thyroid gland, trachea, and esophagus demonstrate no
significant findings.

Lungs/Pleura: Lungs are clear. No pleural effusion or pneumothorax.

Upper Abdomen: No acute abnormality.

Musculoskeletal: No chest wall abnormality. No acute or significant
osseous findings.

Review of the MIP images confirms the above findings.
IMPRESSION: Negative examination for pulmonary embolism or acute cardiopulmonary
disease.

## 2022-01-19 ENCOUNTER — Ambulatory Visit: Payer: 59 | Admitting: Family Medicine

## 2022-01-19 ENCOUNTER — Encounter: Payer: Self-pay | Admitting: Family Medicine

## 2022-01-19 VITALS — BP 123/82 | HR 102 | Temp 98.3°F | Ht 67.5 in | Wt 150.2 lb

## 2022-01-19 DIAGNOSIS — L089 Local infection of the skin and subcutaneous tissue, unspecified: Secondary | ICD-10-CM

## 2022-01-19 DIAGNOSIS — B3731 Acute candidiasis of vulva and vagina: Secondary | ICD-10-CM | POA: Diagnosis not present

## 2022-01-19 DIAGNOSIS — S0123XA Puncture wound without foreign body of nose, initial encounter: Secondary | ICD-10-CM

## 2022-01-19 DIAGNOSIS — N898 Other specified noninflammatory disorders of vagina: Secondary | ICD-10-CM | POA: Diagnosis not present

## 2022-01-19 LAB — WET PREP FOR TRICH, YEAST, CLUE
Clue Cell Exam: NEGATIVE
Trichomonas Exam: NEGATIVE
Yeast Exam: POSITIVE — AB

## 2022-01-19 MED ORDER — AMOXICILLIN-POT CLAVULANATE 875-125 MG PO TABS: 1.0000 | ORAL_TABLET | Freq: Two times a day (BID) | ORAL | 0 refills | Status: AC

## 2022-01-19 MED ORDER — FLUCONAZOLE 150 MG PO TABS
ORAL_TABLET | ORAL | 0 refills | Status: DC
Start: 2022-01-19 — End: 2022-09-08

## 2022-01-19 NOTE — Progress Notes (Signed)
Subjective:  Patient ID: Brianna Bridges, female    DOB: Jun 21, 1995, 26 y.o.   MRN: [id number]  Patient Care Team: Janora Norlander, DO as PCP - General (Family Medicine) Khalaya Mcgurn Hedges, DO as Consulting Physician (Obstetrics and Gynecology) Rogene Houston, MD as Consulting Physician (Gastroenterology) [place], Jennefer Bravo, MD as Referring Physician (Dermatology)   Chief Complaint:  nose ring infected (Has bump on nose with pus for a few weeks.  Got piercing middle of October. ) and Vaginal Itching (X 2-3 days )   HPI: Brianna Bridges is a 26 y.o. female presenting on 01/19/2022 for nose ring infected (Has bump on nose with pus for a few weeks.  Got piercing middle of October. ) and Vaginal Itching (X 2-3 days )   Pt presents today for evaluation of her nose piercing. States she got this in October and has had intermittent redness, swelling, and drainage since then. She does clean it several times per day. Admits to touching it frequently and she does work in a hospital setting. She denies systemic signs of infection. Has not removed the piercing.   Vaginal Itching The patient's primary symptoms include genital itching and vaginal discharge. The patient's pertinent negatives include no pelvic pain. This is a new problem. Episode onset: 4 days ago. The problem occurs constantly. The problem has been gradually worsening. The pain is moderate. The problem affects both sides. She is not pregnant. Pertinent negatives include no abdominal pain, anorexia, back pain, chills, constipation, diarrhea, discolored urine, dysuria, fever, flank pain, frequency, headaches, hematuria, joint pain, joint swelling, nausea, painful intercourse, rash, sore throat, urgency or vomiting. The vaginal discharge was white and thick. There has been no bleeding. The symptoms are aggravated by tactile pressure and urinating. She has tried antifungals for the symptoms. The treatment provided no relief. She is  sexually active. No, her partner does not have an STD.    Relevant past medical, surgical, family, and social history reviewed and updated as indicated.  Allergies and medications reviewed and updated. Data reviewed: Chart in Epic.   Past Medical History:  Diagnosis Date   Abdominal pain, chronic, right lower quadrant 01/29/2019   Abnormal LFTs 11/05/2018   Food intolerance    GERD (gastroesophageal reflux disease)    Hepatitis A    IBS (irritable bowel syndrome)    Polycystic ovary syndrome    Rectal bleeding 04/23/2020   Recurrent tonsillitis 05/24/2017    Past Surgical History:  Procedure Laterality Date   BIOPSY  05/06/2020   Procedure: BIOPSY;  Surgeon: Rogene Houston, MD;  [facility];  Service: Endoscopy;;  duodenal, random colon   CHOLECYSTECTOMY N/A 01/16/2019   Procedure: LAPAROSCOPIC CHOLECYSTECTOMY;  Surgeon: [name], MD;  Location: [facility];  Service: General;  Laterality: N/A;   COLONOSCOPY WITH PROPOFOL N/A 05/06/2020   Procedure: COLONOSCOPY WITH PROPOFOL;  Surgeon: Rogene Houston, MD;  [facility];  Service: Endoscopy;  Laterality: N/A;   ESOPHAGOGASTRODUODENOSCOPY (EGD) WITH PROPOFOL N/A 05/06/2020   Procedure: ESOPHAGOGASTRODUODENOSCOPY (EGD) WITH PROPOFOL;  Surgeon: Rogene Houston, MD;  [facility];  Service: Endoscopy;  Laterality: N/A;  AM   EYE SURGERY Bilateral    Foot surgery Left    Plantars wart   LIVER BIOPSY N/A 01/16/2019   Procedure: LIVER BIOPSY;  Surgeon: [name], MD;  Location: [facility];  Service: General;  Laterality: N/A;   [place] EXTRACTION      Social  History   Socioeconomic History   Marital status: Single    Spouse name: Not on file   Number of children: Not on file   Years of education: Not on file   Highest education level: Not on file  Occupational History   Not on file  Tobacco Use   Smoking status: Never    Passive exposure: Never   Smokeless tobacco: Never   Vaping Use   Vaping Use: Never used  Substance and Sexual Activity   Alcohol use: Not Currently   Drug use: No   Sexual activity: Yes    Birth control/protection: Pill  Other Topics Concern   Not on file  Social History Narrative   Not on file   Social Determinants of Health   Financial Resource Strain: Not on file  Food Insecurity: Not on file  Transportation Needs: Not on file  Physical Activity: Not on file  Stress: Not on file  Social Connections: Not on file  Intimate Partner Violence: Not on file    Outpatient Encounter Medications as of 01/19/2022  Medication Sig   amoxicillin-clavulanate (AUGMENTIN) 875-125 MG tablet Take 1 tablet by mouth 2 (two) times daily for 10 days.   clindamycin (CLINDAGEL) 1 % gel Apply topically 2 (two) times daily.   diltiazem 2 % GEL Apply 1 application. topically 2 (two) times daily.   drospirenone-ethinyl estradiol (YASMIN) 3-0.03 MG tablet Take 1 tablet by mouth daily.   fluconazole (DIFLUCAN) 150 MG tablet 1 po q week x 4 weeks   hydroxypropyl methylcellulose / hypromellose (ISOPTO TEARS / GONIOVISC) 2.5 % ophthalmic solution Place 1 drop into both eyes 2 (two) times daily.   metroNIDAZOLE (METROCREAM) 0.75 % cream Apply 1 application topically at bedtime.   OVER THE COUNTER MEDICATION Ultra Nutrient Multivit capsule BID   pantoprazole (PROTONIX) 40 MG tablet Take 1 tablet (40 mg total) by mouth daily before breakfast.   tretinoin (RETIN-A) 0.025 % cream Apply 1 application topically at bedtime.    diclofenac (VOLTAREN) 75 MG EC tablet Take 1 tablet (75 mg total) by mouth 2 (two) times daily as needed for moderate pain (and swelling). (Patient not taking: Reported on 01/19/2022)   hydrocortisone cream 1 % Apply to affected area 2 times daily (Patient not taking: Reported on 08/25/2021)   ketoconazole (NIZORAL) 2 % shampoo Apply 1 application topically 2 (two) times a week. (Patient not taking: Reported on 01/19/2022)   No  facility-administered encounter medications on file as of 01/19/2022.    Allergies  Allergen Reactions   Oxycodone Itching    Review of Systems  Constitutional:  Negative for activity change, appetite change, chills, diaphoresis, fatigue, fever and unexpected weight change.  HENT: Negative.  Negative for sore throat.   Eyes: Negative.  Negative for photophobia and visual disturbance.  Respiratory:  Negative for cough, chest tightness and shortness of breath.   Cardiovascular:  Negative for chest pain, palpitations and leg swelling.  Gastrointestinal:  Negative for abdominal pain, anorexia, blood in stool, constipation, diarrhea, nausea and vomiting.  Endocrine: Negative.  Negative for polydipsia, polyphagia and polyuria.  Genitourinary:  Positive for vaginal discharge. Negative for decreased urine volume, difficulty urinating, dyspareunia, dysuria, enuresis, flank pain, frequency, genital sores, hematuria, menstrual problem, pelvic pain, urgency, vaginal bleeding and vaginal pain.  Musculoskeletal:  Negative for arthralgias, back pain, joint pain and myalgias.  Skin:  Positive for color change. Negative for rash.  Allergic/Immunologic: Negative.   Neurological:  Negative for dizziness, tremors, seizures, syncope, facial asymmetry, speech  difficulty, weakness, light-headedness, numbness and headaches.  Hematological: Negative.   Psychiatric/Behavioral:  Negative for confusion, hallucinations, sleep disturbance and suicidal ideas.   All other systems reviewed and are negative.       Objective:  BP 123/82   Pulse (!) 102   Temp 98.3 F (36.8 C) (Temporal)   Ht 5' 7.5" (1.715 m)   Wt 150 lb 3.2 oz (68.1 kg)   LMP 01/12/2022   SpO2 96%   BMI 23.18 kg/m    Wt Readings from Last 3 Encounters:  01/19/22 150 lb 3.2 oz (68.1 kg)  09/07/21 158 lb (71.7 kg)  09/03/21 158 lb (71.7 kg)    Physical Exam Vitals and nursing note reviewed.  Constitutional:      General: She is not in  acute distress.    Appearance: Normal appearance. She is normal weight. She is not ill-appearing, toxic-appearing or diaphoretic.  HENT:     Head: Normocephalic and atraumatic.     Right Ear: Tympanic membrane, ear canal and external ear normal.     Left Ear: Tympanic membrane, ear canal and external ear normal.     Nose: Nose normal.     Mouth/Throat:     Mouth: Mucous membranes are moist.     Pharynx: Pharyngeal swelling and posterior oropharyngeal erythema present. No oropharyngeal exudate or uvula swelling.  Eyes:     Conjunctiva/sclera: Conjunctivae normal.     Pupils: Pupils are equal, round, and reactive to light.  Cardiovascular:     Rate and Rhythm: Normal rate and regular rhythm.     Heart sounds: Normal heart sounds.  Pulmonary:     Effort: Pulmonary effort is normal.     Breath sounds: Normal breath sounds.  Genitourinary:    Comments: Deferred, pt did self wet prep Musculoskeletal:     Right lower leg: No edema.     Left lower leg: No edema.  Skin:    General: Skin is warm and dry.     Capillary Refill: Capillary refill takes less than 2 seconds.       Neurological:     General: No focal deficit present.     Mental Status: She is oriented to person, place, and time.  Psychiatric:        Mood and Affect: Mood normal.        Behavior: Behavior normal.        Thought Content: Thought content normal.     Results for orders placed or performed in visit on 01/19/22  WET PREP FOR [place], YEAST, CLUE   Specimen: Vaginal Fluid   Vaginal Flui  Result Value Ref Range   Trichomonas Exam Negative Negative   Yeast Exam Positive (A) Negative   Clue Cell Exam Negative Negative       Pertinent labs & imaging results that were available during my care of the patient were reviewed by me and considered in my medical decision making.  Assessment & Plan:  Treonna was seen today for nose ring infected and vaginal itching.  Diagnoses and all orders for this  visit:  Infected pierced nose Will treat with below. Pt aware of proper cleaning and to keep hands away from face. Does not wish to remove piercing. Aware if infection does not clear, this will likely need to be done. Aware this could also be a reaction to the metal. Medications as prescribed. Report new, worsening, or persistent symptoms.  -     amoxicillin-clavulanate (AUGMENTIN) 875-125 MG tablet; Take 1 tablet  by mouth 2 (two) times daily for 10 days.  Vaginal itching Wet prep positive for yeast, negative for clue cells or trich.  -     WET PREP FOR TRICH, YEAST, CLUE -     fluconazole (DIFLUCAN) 150 MG tablet; 1 po q week x 4 weeks  Vaginal candidiasis Medications as prescribed. Aware of symptomatic care and prevention. Report new, worsening, or persistent symptoms.  -     fluconazole (DIFLUCAN) 150 MG tablet; 1 po q week x 4 weeks     Continue all other maintenance medications.  Follow up plan: Return in 2 weeks (on 02/02/2022), or if symptoms worsen or fail to improve, for reevaluation of nose.    The above assessment and management plan was discussed with the patient. The patient verbalized understanding of and has agreed to the management plan. Patient is aware to call the clinic if they develop any new symptoms or if symptoms persist or worsen. Patient is aware when to return to the clinic for a follow-up visit. Patient educated on when it is appropriate to go to the emergency department.   Monia Pouch, FNP-C [facility] [phone]

## 2022-02-10 ENCOUNTER — Other Ambulatory Visit (INDEPENDENT_AMBULATORY_CARE_PROVIDER_SITE_OTHER): Payer: Self-pay | Admitting: *Deleted

## 2022-02-10 MED ORDER — PANTOPRAZOLE SODIUM 40 MG PO TBEC: 40.0000 mg | DELAYED_RELEASE_TABLET | Freq: Every day | ORAL | 2 refills | Status: DC

## 2022-04-04 DIAGNOSIS — M9902 Segmental and somatic dysfunction of thoracic region: Secondary | ICD-10-CM | POA: Diagnosis not present

## 2022-04-04 DIAGNOSIS — M9903 Segmental and somatic dysfunction of lumbar region: Secondary | ICD-10-CM | POA: Diagnosis not present

## 2022-04-04 DIAGNOSIS — M9901 Segmental and somatic dysfunction of cervical region: Secondary | ICD-10-CM | POA: Diagnosis not present

## 2022-04-04 DIAGNOSIS — M62830 Muscle spasm of back: Secondary | ICD-10-CM | POA: Diagnosis not present

## 2022-04-06 DIAGNOSIS — M9902 Segmental and somatic dysfunction of thoracic region: Secondary | ICD-10-CM | POA: Diagnosis not present

## 2022-04-06 DIAGNOSIS — M9903 Segmental and somatic dysfunction of lumbar region: Secondary | ICD-10-CM | POA: Diagnosis not present

## 2022-04-06 DIAGNOSIS — M62830 Muscle spasm of back: Secondary | ICD-10-CM | POA: Diagnosis not present

## 2022-04-06 DIAGNOSIS — M9901 Segmental and somatic dysfunction of cervical region: Secondary | ICD-10-CM | POA: Diagnosis not present

## 2022-05-16 DIAGNOSIS — Z6826 Body mass index (BMI) 26.0-26.9, adult: Secondary | ICD-10-CM | POA: Diagnosis not present

## 2022-05-16 DIAGNOSIS — Z01419 Encounter for gynecological examination (general) (routine) without abnormal findings: Secondary | ICD-10-CM | POA: Diagnosis not present

## 2022-05-28 ENCOUNTER — Other Ambulatory Visit (INDEPENDENT_AMBULATORY_CARE_PROVIDER_SITE_OTHER): Payer: Self-pay | Admitting: Gastroenterology

## 2022-08-30 ENCOUNTER — Other Ambulatory Visit (HOSPITAL_COMMUNITY): Payer: Self-pay

## 2022-08-31 ENCOUNTER — Other Ambulatory Visit (HOSPITAL_COMMUNITY): Payer: Self-pay

## 2022-08-31 ENCOUNTER — Other Ambulatory Visit: Payer: Self-pay

## 2022-08-31 MED ORDER — PSEUDOEPH-BROMPHEN-DM 30-2-10 MG/5ML PO SYRP: 10.0000 mL | ORAL_SOLUTION | Freq: Four times a day (QID) | ORAL | 0 refills | Status: AC

## 2022-08-31 MED ORDER — TRETINOIN 0.025 % EX CREA
1.0000 "application " | TOPICAL_CREAM | Freq: Every evening | CUTANEOUS | 1 refills | Status: DC
  Filled 2022-08-31: qty 45, 30d supply, fill #0

## 2022-08-31 MED ORDER — DROSPIRENONE-ETHINYL ESTRADIOL 3-0.03 MG PO TABS
1.0000 | ORAL_TABLET | Freq: Every day | ORAL | 3 refills | Status: DC
Start: 2022-06-02 — End: 2023-12-06
  Filled 2022-08-31: qty 84, 84d supply, fill #0

## 2022-08-31 MED ORDER — FLUOXETINE HCL 20 MG PO CAPS
20.0000 mg | ORAL_CAPSULE | Freq: Every day | ORAL | 3 refills | Status: DC
  Filled 2022-08-31: qty 90, 90d supply, fill #0

## 2022-09-01 ENCOUNTER — Other Ambulatory Visit (HOSPITAL_COMMUNITY): Payer: Self-pay

## 2022-09-01 ENCOUNTER — Other Ambulatory Visit: Payer: Self-pay

## 2022-09-06 ENCOUNTER — Other Ambulatory Visit (HOSPITAL_COMMUNITY): Payer: Self-pay

## 2022-09-06 ENCOUNTER — Other Ambulatory Visit: Payer: Self-pay

## 2022-09-07 ENCOUNTER — Other Ambulatory Visit (HOSPITAL_COMMUNITY): Payer: Self-pay

## 2022-09-08 ENCOUNTER — Ambulatory Visit: Payer: Commercial Managed Care - PPO | Admitting: Family Medicine

## 2022-09-08 ENCOUNTER — Encounter: Payer: Self-pay | Admitting: Family Medicine

## 2022-09-08 VITALS — BP 136/94 | HR 95 | Temp 98.7°F | Ht 67.5 in | Wt 167.0 lb

## 2022-09-08 DIAGNOSIS — L290 Pruritus ani: Secondary | ICD-10-CM | POA: Diagnosis not present

## 2022-09-08 MED ORDER — TRIAMCINOLONE ACETONIDE 0.025 % EX OINT: 1.0000 | TOPICAL_OINTMENT | Freq: Two times a day (BID) | CUTANEOUS | 0 refills | Status: DC

## 2022-09-08 NOTE — Progress Notes (Signed)
Acute Office Visit  Subjective:  Patient ID: Brianna Bridges, female    DOB: August 21, 1995, 27 y.o.   MRN: [id number]  Chief Complaint  Patient presents with   Anal Itching   HPI Patient is in today for anal itching. Denies any changes to detergents and soap. Does not wax or soap. States that it started 2 months ago and is constant.  Does not know of anything that makes it better or worse  Tried prep H, has not helped.  States BM's fluctuate with IBS, more loose than hard and constipated.   ROS As per HPI  Objective:  BP (!) 136/94   Pulse 95   Temp 98.7 F (37.1 C)   Ht 5' 7.5" (1.715 m)   Wt 167 lb (75.8 kg)   LMP 08/22/2022 (Exact Date)   SpO2 97%   BMI 25.77 kg/m   Physical Exam Constitutional:      General: She is awake. She is not in acute distress.    Appearance: Normal appearance. She is well-developed and well-groomed. She is not ill-appearing, toxic-appearing or diaphoretic.  Cardiovascular:     Rate and Rhythm: Normal rate.     Pulses: Normal pulses.          Radial pulses are 2+ on the right side and 2+ on the left side.       Posterior tibial pulses are 2+ on the right side and 2+ on the left side.     Heart sounds: Normal heart sounds. No murmur heard.    No gallop.  Pulmonary:     Effort: Pulmonary effort is normal. No respiratory distress.     Breath sounds: Normal breath sounds. No stridor. No wheezing, rhonchi or rales.  Genitourinary:    Rectum: Mass present. No tenderness, anal fissure, external hemorrhoid or internal hemorrhoid. Normal anal tone.     Comments: Denied chaperone. Skin tag present. No excoriation, erythema.  Musculoskeletal:     Cervical back: Full passive range of motion without pain and neck supple.     Right lower leg: No edema.     Left lower leg: No edema.  Skin:    General: Skin is warm.     Capillary Refill: Capillary refill takes less than 2 seconds.  Neurological:     General: No focal deficit present.     Mental  Status: She is alert, oriented to person, place, and time and easily aroused. Mental status is at baseline.     GCS: GCS eye subscore is 4. GCS verbal subscore is 5. GCS motor subscore is 6.     Motor: No weakness.  Psychiatric:        Attention and Perception: Attention and perception normal.        Mood and Affect: Mood and affect normal.        Speech: Speech normal.        Behavior: Behavior normal. Behavior is cooperative.        Thought Content: Thought content normal. Thought content does not include homicidal or suicidal ideation. Thought content does not include homicidal or suicidal plan.        Cognition and Memory: Cognition and memory normal.        Judgment: Judgment normal.       01/19/2022    3:54 PM 08/25/2021    8:28 AM 03/12/2020   11:18 AM  Depression screen PHQ 2/9  Decreased Interest 0 0 0  Down, Depressed, Hopeless 0 0 0  PHQ -  2 Score 0 0 0  Altered sleeping   0  Tired, decreased energy   0  Change in appetite   0  Feeling bad or failure about yourself    0  Trouble concentrating   0  Moving slowly or fidgety/restless   0  Suicidal thoughts   0  PHQ-9 Score   0      08/25/2021    8:28 AM  GAD 7 : Generalized Anxiety Score  Nervous, Anxious, on Edge 0  Control/stop worrying 0  Worry too much - different things 0  Trouble relaxing 0  Restless 0  Easily annoyed or irritable 0  Afraid - awful might happen 0  Total GAD 7 Score 0  Anxiety Difficulty Not difficult at all   Assessment & Plan:  1. Anal itching Discussed with patient mixing with OTC zinc oxide. Discussed hygiene and foods to avoid. Patient to follow up if symptoms do not improve. Discussed with patient to not use triamcinolone for more than 2 weeks.  - triamcinolone (KENALOG) 0.025 % ointment; Apply 1 Application topically 2 (two) times daily.  Dispense: 30 g; Refill: 0  The above assessment and management plan was discussed with the patient. The patient verbalized understanding of and has  agreed to the management plan using shared-decision making. Patient is aware to call the clinic if they develop any new symptoms or if symptoms fail to improve or worsen. Patient is aware when to return to the clinic for a follow-up visit. Patient educated on when it is appropriate to go to the emergency department.   Return if symptoms worsen or fail to improve.  Neale Burly, DNP-FNP [facility] [address] [place], [place] [address] [phone]

## 2022-09-15 ENCOUNTER — Encounter: Payer: Self-pay | Admitting: Nurse Practitioner

## 2022-09-15 ENCOUNTER — Ambulatory Visit (INDEPENDENT_AMBULATORY_CARE_PROVIDER_SITE_OTHER): Payer: Commercial Managed Care - PPO | Admitting: Nurse Practitioner

## 2022-09-15 VITALS — BP 120/81 | HR 90 | Temp 98.3°F | Ht 67.5 in | Wt 167.4 lb

## 2022-09-15 DIAGNOSIS — Z0000 Encounter for general adult medical examination without abnormal findings: Secondary | ICD-10-CM

## 2022-09-15 LAB — CMP14+EGFR
ALT: 19 IU/L (ref 0–32)
AST: 18 IU/L (ref 0–40)
Albumin: 4.2 g/dL (ref 4.0–5.0)
Alkaline Phosphatase: 30 IU/L — ABNORMAL LOW (ref 44–121)
BUN/Creatinine Ratio: 14 (ref 9–23)
BUN: 12 mg/dL (ref 6–20)
Bilirubin Total: 0.3 mg/dL (ref 0.0–1.2)
CO2: 21 mmol/L (ref 20–29)
Calcium: 9.6 mg/dL (ref 8.7–10.2)
Chloride: 103 mmol/L (ref 96–106)
Creatinine, Ser: 0.84 mg/dL (ref 0.57–1.00)
Globulin, Total: 2.3 g/dL (ref 1.5–4.5)
Glucose: 94 mg/dL (ref 70–99)
Potassium: 4.4 mmol/L (ref 3.5–5.2)
Sodium: 138 mmol/L (ref 134–144)
Total Protein: 6.5 g/dL (ref 6.0–8.5)
eGFR: 98 mL/min/{1.73_m2} (ref >59)

## 2022-09-15 LAB — CBC WITH DIFFERENTIAL/PLATELET
Basophils Absolute: 0 10*3/uL (ref 0.0–0.2)
Basos: 1 %
EOS (ABSOLUTE): 0 10*3/uL (ref 0.0–0.4)
Eos: 1 %
Hematocrit: 41 % (ref 34.0–46.6)
Hemoglobin: 13.7 g/dL (ref 11.1–15.9)
Immature Grans (Abs): 0 10*3/uL (ref 0.0–0.1)
Immature Granulocytes: 0 %
Lymphocytes Absolute: 1.6 10*3/uL (ref 0.7–3.1)
Lymphs: 33 %
MCH: 29.3 pg (ref 26.6–33.0)
MCHC: 33.4 g/dL (ref 31.5–35.7)
MCV: 88 fL (ref 79–97)
Monocytes Absolute: 0.3 10*3/uL (ref 0.1–0.9)
Monocytes: 7 %
Neutrophils Absolute: 2.9 10*3/uL (ref 1.4–7.0)
Neutrophils: 58 %
Platelets: 302 10*3/uL (ref 150–450)
RBC: 4.68 x10E6/uL (ref 3.77–5.28)
RDW: 12.5 % (ref 11.7–15.4)
WBC: 4.9 10*3/uL (ref 3.4–10.8)

## 2022-09-15 LAB — LIPID PANEL

## 2022-09-15 LAB — THYROID PANEL WITH TSH

## 2022-09-15 LAB — BAYER DCA HB A1C WAIVED: HB A1C (BAYER DCA - WAIVED): 5.3 % (ref 4.8–5.6)

## 2022-09-15 NOTE — Progress Notes (Signed)
Complete physical exam  Patient: Brianna Bridges   DOB: 08-01-1995   27 y.o. Female  MRN: [id number]  Subjective:    Chief Complaint  Patient presents with   Annual Exam    Brianna Bridges is a 27 y.o. female who presents today for a complete physical exam. She reports consuming a general diet. The patient does not participate in regular exercise at present. She generally feels well. She reports sleeping well. She does not have additional problems to discuss today.   On PO contraceptive LMP 08/22/2022  Vision:Feb 2024 Dental: Last dental visit: 08/15/2022 Last Pap:  STD: Declined to answer.  Immunization status: up to date and documented.  Most recent fall risk assessment:    09/15/2022   10:10 AM  Fall Risk   Falls in the past year? 0  Number falls in past yr: 0  Injury with Fall? 0  Risk for fall due to : No Fall Risks     Most recent depression screenings:    01/19/2022    3:54 PM 08/25/2021    8:28 AM  PHQ 2/9 Scores  PHQ - 2 Score 0 0      Patient Active Problem List   Diagnosis Date Noted   Finger pain, right 09/03/2021   Anal fissure 04/27/2021   GERD (gastroesophageal reflux disease) 10/22/2019   Anal skin tag 03/04/2019   IBS (irritable bowel syndrome)    Calculus of gallbladder without cholecystitis without obstruction 12/04/2018   Past Medical History:  Diagnosis Date   Abdominal pain, chronic, right lower quadrant 01/29/2019   Abnormal LFTs 11/05/2018   Food intolerance    GERD (gastroesophageal reflux disease)    Hepatitis A    IBS (irritable bowel syndrome)    Polycystic ovary syndrome    Rectal bleeding 04/23/2020   Recurrent tonsillitis 05/24/2017   Past Surgical History:  Procedure Laterality Date   BIOPSY  05/06/2020   Procedure: BIOPSY;  Surgeon: Malissa Hippo, MD;  [facility];  Service: Endoscopy;;  duodenal, random colon   CHOLECYSTECTOMY N/A 01/16/2019   Procedure: LAPAROSCOPIC CHOLECYSTECTOMY;  Surgeon: Lucretia Roers, MD;  Location: [facility];  Service: General;  Laterality: N/A;   COLONOSCOPY WITH PROPOFOL N/A 05/06/2020   Procedure: COLONOSCOPY WITH PROPOFOL;  Surgeon: Malissa Hippo, MD;  [facility]: [facility];  Service: Endoscopy;  Laterality: N/A;   ESOPHAGOGASTRODUODENOSCOPY (EGD) WITH PROPOFOL N/A 05/06/2020   Procedure: ESOPHAGOGASTRODUODENOSCOPY (EGD) WITH PROPOFOL;  Surgeon: Malissa Hippo, MD;  [facility]: [facility];  Service: Endoscopy;  Laterality: N/A;  AM   EYE SURGERY Bilateral    Foot surgery Left    Plantars wart   LIVER BIOPSY N/A 01/16/2019   Procedure: LIVER BIOPSY;  Surgeon: Lucretia Roers, MD;  Location: [facility];  Service: General;  Laterality: N/A;   WISDOM TOOTH EXTRACTION     Social History   Tobacco Use   Smoking status: Never    Passive exposure: Never   Smokeless tobacco: Never  Vaping Use   Vaping status: Never Used  Substance Use Topics   Alcohol use: Not Currently   Drug use: No   Social History   Socioeconomic History   Marital status: Single    Spouse name: Not on file   Number of children: Not on file   Years of education: Not on file   Highest education level: Not on file  Occupational History   Not on file  Tobacco Use   Smoking  status: Never    Passive exposure: Never   Smokeless tobacco: Never  Vaping Use   Vaping status: Never Used  Substance and Sexual Activity   Alcohol use: Not Currently   Drug use: No   Sexual activity: Yes    Birth control/protection: Pill  Other Topics Concern   Not on file  Social History Narrative   Not on file   Social Determinants of Health   Financial Resource Strain: Not on file  Food Insecurity: Not on file  Transportation Needs: Not on file  Physical Activity: Not on file  Stress: Not on file  Social Connections: Not on file  Intimate Partner Violence: Not on file   Family Status  Relation Name Status   Mother reilley skalski Alive   Father  Alive   Sister  Alive  No partnership data  on file   Family History  Problem Relation Age of Onset   GER disease Mother    Hyperlipidemia Father    High Cholesterol Father    Healthy Sister    Allergies  Allergen Reactions   Hydrocodone Itching   Oxycodone Itching      Patient Care Team: Raliegh Ip, DO as PCP - General (Family Medicine) Mitchel Honour, DO as Consulting Physician (Obstetrics and Gynecology) Malissa Hippo, MD (Inactive) as Consulting Physician (Gastroenterology) Haverstock, Elvin So, MD as Referring Physician (Dermatology)   Outpatient Medications Prior to Visit  Medication Sig   ALPRAZolam (XANAX) 0.5 MG tablet Take 1 tablet by mouth every 6 (six) hours as needed.   clindamycin (CLEOCIN T) 1 % lotion APPLY AS DIRECTED EXTERNALLY PEA SIZE AMOUNT TO WHOLE FACE IN THE MORNING 30 DAYS EXTERNAL for 30   drospirenone-ethinyl estradiol (YASMIN) 3-0.03 MG tablet Take 1 tablet by mouth daily.   FLUoxetine (PROZAC) 20 MG capsule Take 1 capsule (20 mg total) by mouth daily.   hydroxypropyl methylcellulose / hypromellose (ISOPTO TEARS / GONIOVISC) 2.5 % ophthalmic solution Place 1 drop into both eyes 2 (two) times daily.   ketoconazole (NIZORAL) 2 % shampoo Apply 1 application  topically 2 (two) times a week.   metroNIDAZOLE (METROCREAM) 0.75 % cream Apply 1 application topically at bedtime.   pantoprazole (PROTONIX) 40 MG tablet Take 1 tablet (40 mg total) by mouth daily before breakfast.   tretinoin (RETIN-A) 0.025 % cream Apply 1 application topically at bedtime.    triamcinolone (KENALOG) 0.025 % ointment Apply 1 Application topically 2 (two) times daily.   No facility-administered medications prior to visit.    Review of Systems  Constitutional:  Negative for chills and fever.  Eyes:  Negative for double vision and pain.  Respiratory:  Negative for cough and shortness of breath.   Cardiovascular:  Negative for chest pain and leg swelling.  Gastrointestinal:  Negative for blood in stool,  melena, nausea and vomiting.  Genitourinary:  Negative for dysuria and frequency.  Musculoskeletal:  Negative for falls and myalgias.  Neurological:  Negative for dizziness and tingling.  Endo/Heme/Allergies:  Negative for polydipsia. Does not bruise/bleed easily.  Psychiatric/Behavioral:  Negative for depression and suicidal ideas. The patient does not have insomnia.    Negative unless indicated in HPI    Objective:     BP 120/81   Pulse 90   Temp 98.3 F (36.8 C) (Temporal)   Ht 5' 7.5" (1.715 m)   Wt 167 lb 6.4 oz (75.9 kg)   LMP 08/22/2022 (Exact Date)   SpO2 98%   BMI 25.83 kg/m  BP  Readings from Last 3 Encounters:  09/15/22 120/81  09/08/22 (!) 136/94  01/19/22 123/82   Wt Readings from Last 3 Encounters:  09/15/22 167 lb 6.4 oz (75.9 kg)  09/08/22 167 lb (75.8 kg)  01/19/22 150 lb 3.2 oz (68.1 kg)      Physical Exam Vitals and nursing note reviewed.  Constitutional:      Appearance: Normal appearance. She is overweight.  HENT:     Head: Normocephalic and atraumatic.  Eyes:     General: No scleral icterus.    Extraocular Movements: Extraocular movements intact.     Conjunctiva/sclera: Conjunctivae normal.     Pupils: Pupils are equal, round, and reactive to light.  Neck:     Vascular: No carotid bruit.  Cardiovascular:     Rate and Rhythm: Normal rate and regular rhythm.  Pulmonary:     Effort: Pulmonary effort is normal.     Breath sounds: Normal breath sounds.  Abdominal:     General: Bowel sounds are normal.     Palpations: Abdomen is soft.     Tenderness: There is no abdominal tenderness. There is no right CVA tenderness, left CVA tenderness, guarding or rebound.  Musculoskeletal:        General: Normal range of motion.     Cervical back: Normal range of motion and neck supple. No rigidity or tenderness.     Right lower leg: No edema.     Left lower leg: No edema.  Lymphadenopathy:     Cervical: No cervical adenopathy.  Skin:    General: Skin  is warm and dry.     Coloration: Skin is not jaundiced.     Findings: No rash.  Neurological:     Mental Status: She is alert and oriented to person, place, and time. Mental status is at baseline.  Psychiatric:        Mood and Affect: Mood normal.        Behavior: Behavior normal.        Thought Content: Thought content normal.        Judgment: Judgment normal.      No results found for any visits on 09/15/22. Last CBC Lab Results  Component Value Date   WBC 4.7 08/25/2021   HGB 15.0 08/25/2021   HCT 43.9 08/25/2021   MCV 87 08/25/2021   MCH 29.6 08/25/2021   RDW 12.4 08/25/2021   PLT 280 08/25/2021   Last metabolic panel Lab Results  Component Value Date   GLUCOSE 92 08/25/2021   NA 140 08/25/2021   K 4.5 08/25/2021   CL 102 08/25/2021   CO2 23 08/25/2021   BUN 14 08/25/2021   CREATININE 0.88 08/25/2021   EGFR 93 08/25/2021   CALCIUM 9.5 08/25/2021   PROT 7.1 08/25/2021   ALBUMIN 4.7 08/25/2021   LABGLOB 2.4 08/25/2021   AGRATIO 2.0 08/25/2021   BILITOT 0.5 08/25/2021   ALKPHOS 38 (L) 08/25/2021   AST 18 08/25/2021   ALT 17 08/25/2021   ANIONGAP 11 02/11/2020   Last thyroid functions Lab Results  Component Value Date   TSH 2.290 06/08/2017        Assessment & Plan:    Routine Health Maintenance and Physical Exam  Discussed health benefits of physical activity, and encouraged her to engage in regular exercise appropriate for her age and condition.  Routine medical exam -     CBC with Differential/Platelet -     CMP14+EGFR -     Lipid panel -  Bayer DCA Hb A1c Waived -     Thyroid Panel With TSH    Zafira was seen today for annual physical, no acute distress BMI 25.83 advised on increase exercise and better diet Client understand the plan of care, all questions answered   Counseled on  healthy lifestyle choices, including diet (rich in fruits, vegetables, and lean proteins, and low in salt and simple carbohydrates) and exercise (at least 30  minutes of moderate physical activity daily).     The above assessment and management plan was discussed with the patient. The patient verbalized understanding of and has agreed to the management plan. Patient is aware to call the clinic if they develop any new symptoms or if symptoms persist or worsen. Patient is aware when to return to the clinic for a follow-up visit. Patient educated on when it is appropriate to go to the emergency department.   Return in about 1 year (around 09/15/2023) for physical.     Martina Sinner, DNP [facility] [address] [place], [place] [address] [phone]

## 2022-09-26 ENCOUNTER — Ambulatory Visit
Admission: EM | Admit: 2022-09-26 | Discharge: 2022-09-26 | Disposition: A | Discharge status: Home / Self Care | Payer: Commercial Managed Care - PPO | Attending: Family Medicine | Admitting: Family Medicine

## 2022-09-26 DIAGNOSIS — J029 Acute pharyngitis, unspecified: Secondary | ICD-10-CM | POA: Insufficient documentation

## 2022-09-26 DIAGNOSIS — Z1152 Encounter for screening for COVID-19: Secondary | ICD-10-CM | POA: Diagnosis not present

## 2022-09-26 DIAGNOSIS — R509 Fever, unspecified: Secondary | ICD-10-CM | POA: Diagnosis not present

## 2022-09-26 LAB — POCT RAPID STREP A (OFFICE): Rapid Strep A Screen: NEGATIVE

## 2022-09-26 NOTE — ED Provider Notes (Signed)
[facility]    CSN: [id number] Arrival date & time: 09/26/22  0802      History   Chief Complaint Chief Complaint  Patient presents with   Sore Throat    HPI Brianna Bridges is a 27 y.o. female.   Patient presenting today with 2-day history of sore swollen feeling throat, mild congestion, fever, chills, body aches.  Denies chest pain, shortness of breath, abdominal pain, nausea vomiting or diarrhea.  So far trying Tylenol with minimal relief.  No known sick contacts recently.  History of recurrent tonsillitis.    Past Medical History:  Diagnosis Date   Abdominal pain, chronic, right lower quadrant 01/29/2019   Abnormal LFTs 11/05/2018   Food intolerance    GERD (gastroesophageal reflux disease)    Hepatitis A    IBS (irritable bowel syndrome)    Polycystic ovary syndrome    Rectal bleeding 04/23/2020   Recurrent tonsillitis 05/24/2017    Patient Active Problem List   Diagnosis Date Noted   Finger pain, right 09/03/2021   Anal fissure 04/27/2021   GERD (gastroesophageal reflux disease) 10/22/2019   Anal skin tag 03/04/2019   IBS (irritable bowel syndrome)    Calculus of gallbladder without cholecystitis without obstruction 12/04/2018    Past Surgical History:  Procedure Laterality Date   BIOPSY  05/06/2020   Procedure: BIOPSY;  Surgeon: Malissa Hippo, MD;  [facility];  Service: Endoscopy;;  duodenal, random colon   CHOLECYSTECTOMY N/A 01/16/2019   Procedure: LAPAROSCOPIC CHOLECYSTECTOMY;  Surgeon: Lucretia Roers, MD;  Location: [facility];  Service: General;  Laterality: N/A;   COLONOSCOPY WITH PROPOFOL N/A 05/06/2020   Procedure: COLONOSCOPY WITH PROPOFOL;  Surgeon: Malissa Hippo, MD;  [facility];  Service: Endoscopy;  Laterality: N/A;   ESOPHAGOGASTRODUODENOSCOPY (EGD) WITH PROPOFOL N/A 05/06/2020   Procedure: ESOPHAGOGASTRODUODENOSCOPY (EGD) WITH PROPOFOL;  Surgeon: Malissa Hippo, MD;  [facility];   Service: Endoscopy;  Laterality: N/A;  AM   EYE SURGERY Bilateral    Foot surgery Left    Plantars wart   LIVER BIOPSY N/A 01/16/2019   Procedure: LIVER BIOPSY;  Surgeon: Lucretia Roers, MD;  Location: [facility];  Service: General;  Laterality: N/A;   WISDOM TOOTH EXTRACTION      OB History   No obstetric history on file.      Home Medications    Prior to Admission medications   Medication Sig Start Date End Date Taking? Authorizing Provider  ALPRAZolam Prudy Feeler) 0.5 MG tablet Take 1 tablet by mouth every 6 (six) hours as needed.   Yes [provider]  clindamycin (CLEOCIN T) 1 % lotion APPLY AS DIRECTED EXTERNALLY PEA SIZE AMOUNT TO WHOLE FACE IN THE MORNING 30 DAYS EXTERNAL for 30   Yes [provider]  drospirenone-ethinyl estradiol (YASMIN) 3-0.03 MG tablet Take 1 tablet by mouth daily. 06/02/22  Yes   FLUoxetine (PROZAC) 20 MG capsule Take 1 capsule (20 mg total) by mouth daily. 10/28/21  Yes   hydroxypropyl methylcellulose / hypromellose (ISOPTO TEARS / GONIOVISC) 2.5 % ophthalmic solution Place 1 drop into both eyes 2 (two) times daily.   Yes [provider]  metroNIDAZOLE (METROCREAM) 0.75 % cream Apply 1 application topically at bedtime. 02/20/20  Yes [provider]  pantoprazole (PROTONIX) 40 MG tablet Take 1 tablet (40 mg total) by mouth daily before breakfast. 02/10/22  Yes Carlan, Chelsea L, NP  tretinoin (RETIN-A) 0.025 % cream Apply 1 application topically  at bedtime.  09/05/18  Yes [provider]  ketoconazole (NIZORAL) 2 % shampoo Apply 1 application  topically 2 (two) times a week. 02/20/20   [provider]  triamcinolone (KENALOG) 0.025 % ointment Apply 1 Application topically 2 (two) times daily. 09/08/22   Milian, Aleen Campi, FNP    Family History Family History  Problem Relation Age of Onset   GER disease Mother    Hyperlipidemia Father    High Cholesterol Father    Healthy Sister     Social  History Social History   Tobacco Use   Smoking status: Never    Passive exposure: Never   Smokeless tobacco: Never  Vaping Use   Vaping status: Never Used  Substance Use Topics   Alcohol use: Not Currently   Drug use: No     Allergies   Hydrocodone and Oxycodone   Review of Systems Review of Systems Per HPI  Physical Exam Triage Vital Signs ED Triage Vitals  Encounter Vitals Group     BP 09/26/22 0821 122/82     Systolic BP Percentile --      Diastolic BP Percentile --      Pulse Rate 09/26/22 0821 94     Resp 09/26/22 0821 18     Temp 09/26/22 0821 98.7 F (37.1 C)     Temp Source 09/26/22 0821 Oral     SpO2 09/26/22 0821 95 %     Weight --      Height --      Head Circumference --      Peak Flow --      Pain Score 09/26/22 0822 6     Pain Loc --      Pain Education --      Exclude from Growth Chart --    No data found.  Updated Vital Signs BP 122/82 (BP Location: Right Arm)   Pulse 94   Temp 98.7 F (37.1 C) (Oral)   Resp 18   LMP 09/22/2022 (Exact Date)   SpO2 95%   Visual Acuity Right Eye Distance:   Left Eye Distance:   Bilateral Distance:    Right Eye Near:   Left Eye Near:    Bilateral Near:     Physical Exam Vitals and nursing note reviewed.  Constitutional:      Appearance: Normal appearance.  HENT:     Head: Atraumatic.     Right Ear: Tympanic membrane and external ear normal.     Left Ear: Tympanic membrane and external ear normal.     Nose: Nose normal.     Mouth/Throat:     Mouth: Mucous membranes are moist.     Pharynx: Oropharyngeal exudate and posterior oropharyngeal erythema present.  Eyes:     Extraocular Movements: Extraocular movements intact.     Conjunctiva/sclera: Conjunctivae normal.  Cardiovascular:     Rate and Rhythm: Normal rate and regular rhythm.     Heart sounds: Normal heart sounds.  Pulmonary:     Effort: Pulmonary effort is normal.     Breath sounds: Normal breath sounds. No wheezing or rales.   Musculoskeletal:        General: Normal range of motion.     Cervical back: Normal range of motion and neck supple. No tenderness.  Skin:    General: Skin is warm and dry.  Neurological:     Mental Status: She is alert and oriented to person, place, and time.  Psychiatric:  Mood and Affect: Mood normal.        Thought Content: Thought content normal.      UC Treatments / Results  Labs (all labs ordered are listed, but only abnormal results are displayed) Labs Reviewed  CULTURE, GROUP A STREP ([facility])  SARS CORONAVIRUS 2 (TAT 6-24 HRS)  POCT RAPID STREP A (OFFICE)    EKG   Radiology No results found.  Procedures Procedures (including critical care time)  Medications Ordered in UC Medications - No data to display  Initial Impression / Assessment and Plan / UC Course  I have reviewed the triage vital signs and the nursing notes.  Pertinent labs & imaging results that were available during my care of the patient were reviewed by me and considered in my medical decision making (see chart for details).     Vital signs benign within normal limits today, exam suspicious for viral versus bacterial pharyngitis.  Rapid strep negative, throat culture and COVID testing pending.  Discussed supportive over-the-counter medications, home care while awaiting results and adjustment of medications as needed based on these.  Final Clinical Impressions(s) / UC Diagnoses   Final diagnoses:  Acute pharyngitis, unspecified etiology  Fever, unspecified   Discharge Instructions   None    ED Prescriptions   None    PDMP not reviewed this encounter.   Roosvelt Maser Brodnax, [place] 09/26/22 [phone]

## 2022-09-26 NOTE — ED Triage Notes (Signed)
Sore throat, fever 100.5, body aches, chills, that started Saturday. Taking tylenol.

## 2022-09-27 LAB — SARS CORONAVIRUS 2 (TAT 6-24 HRS): SARS Coronavirus 2: NEGATIVE

## 2022-09-29 LAB — CULTURE, GROUP A STREP (THRC)

## 2022-10-27 ENCOUNTER — Encounter: Payer: Self-pay | Admitting: Family Medicine

## 2022-10-27 ENCOUNTER — Other Ambulatory Visit: Payer: Self-pay | Admitting: Family Medicine

## 2022-10-27 ENCOUNTER — Ambulatory Visit: Payer: Commercial Managed Care - PPO | Admitting: Family Medicine

## 2022-10-27 VITALS — BP 116/71 | HR 90 | Temp 98.5°F | Ht 67.5 in | Wt 159.0 lb

## 2022-10-27 DIAGNOSIS — J358 Other chronic diseases of tonsils and adenoids: Secondary | ICD-10-CM | POA: Diagnosis not present

## 2022-10-27 DIAGNOSIS — J020 Streptococcal pharyngitis: Secondary | ICD-10-CM | POA: Diagnosis not present

## 2022-10-27 LAB — CULTURE, GROUP A STREP

## 2022-10-27 LAB — RAPID STREP SCREEN (MED CTR MEBANE ONLY): Strep Gp A Ag, IA W/Reflex: NEGATIVE

## 2022-10-27 MED ORDER — AMOXICILLIN 500 MG PO CAPS: 500.0000 mg | ORAL_CAPSULE | Freq: Two times a day (BID) | ORAL | 0 refills | Status: AC

## 2022-10-27 MED ORDER — FLUCONAZOLE 150 MG PO TABS: 150.0000 mg | ORAL_TABLET | Freq: Once | ORAL | 0 refills | Status: AC

## 2022-10-27 NOTE — Progress Notes (Addendum)
Subjective:  Patient ID: Brianna Bridges, female    DOB: 1995-04-07, 27 y.o.   MRN: [id number]  Patient Care Team: Raliegh Ip, DO as PCP - General (Family Medicine) Mitchel Honour, DO as Consulting Physician (Obstetrics and Gynecology) Malissa Hippo, MD (Inactive) as Consulting Physician (Gastroenterology) Haverstock, Elvin So, MD as Referring Physician (Dermatology)   Chief Complaint:  Sore Throat (X 3 days)   HPI: Brianna Bridges is a 27 y.o. female presenting on 10/27/2022 for Sore Throat (X 3 days)  Sore Throat    One month ago she went to [facility] for fever x 3 days and sore throat. She was negative on screening at that time. She did not receive antibiotics at that time.  States that she has a history of strep. Reports that at one time she had strep pharyngitis 6-7 times in one year. Reports that she has been positive for strep B in the past.  Reports that her sore throat returned a few days ago. She has "White streaks". Denies fever, N/V/D, and cough. 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   Relevant past medical, surgical, family, and social history reviewed and updated as indicated.  Allergies and medications reviewed and updated. Data reviewed: Chart in Epic.   Past Medical History:  Diagnosis Date   Abdominal pain, chronic, right lower quadrant 01/29/2019   Abnormal LFTs 11/05/2018   Food intolerance    GERD (gastroesophageal reflux disease)    Hepatitis A    IBS (irritable bowel syndrome)    Polycystic ovary syndrome    Rectal bleeding 04/23/2020   Recurrent tonsillitis 05/24/2017    Past Surgical History:  Procedure Laterality Date   BIOPSY  05/06/2020   Procedure: BIOPSY;  Surgeon: Malissa Hippo, MD;  [facility];  Service: Endoscopy;;  duodenal, random colon   CHOLECYSTECTOMY N/A 01/16/2019   Procedure: LAPAROSCOPIC CHOLECYSTECTOMY;  Surgeon: Lucretia Roers, MD;  Location: [facility];  Service: General;   Laterality: N/A;   COLONOSCOPY WITH PROPOFOL N/A 05/06/2020   Procedure: COLONOSCOPY WITH PROPOFOL;  Surgeon: Malissa Hippo, MD;  [facility];  Service: Endoscopy;  Laterality: N/A;   ESOPHAGOGASTRODUODENOSCOPY (EGD) WITH PROPOFOL N/A 05/06/2020   Procedure: ESOPHAGOGASTRODUODENOSCOPY (EGD) WITH PROPOFOL;  Surgeon: Malissa Hippo, MD;  [facility];  Service: Endoscopy;  Laterality: N/A;  AM   EYE SURGERY Bilateral    Foot surgery Left    Plantars wart   LIVER BIOPSY N/A 01/16/2019   Procedure: LIVER BIOPSY;  Surgeon: Lucretia Roers, MD;  Location: [facility];  Service: General;  Laterality: N/A;   WISDOM TOOTH EXTRACTION      Social History   Socioeconomic History   Marital status: Single    Spouse name: Not on file   Number of children: Not on file   Years of education: Not on file   Highest education level: Not on file  Occupational History   Not on file  Tobacco Use   Smoking status: Never    Passive exposure: Never   Smokeless tobacco: Never  Vaping Use   Vaping status: Never Used  Substance and Sexual Activity   Alcohol use: Not Currently   Drug use: No   Sexual activity: Yes    Birth control/protection: Pill  Other Topics Concern   Not on file  Social History Narrative   Not on file   Social Determinants of Health   Financial Resource Strain:  Not on file  Food Insecurity: Not on file  Transportation Needs: Not on file  Physical Activity: Not on file  Stress: Not on file  Social Connections: Not on file  Intimate Partner Violence: Not on file    Outpatient Encounter Medications as of 10/27/2022  Medication Sig   clindamycin (CLEOCIN T) 1 % lotion APPLY AS DIRECTED EXTERNALLY PEA SIZE AMOUNT TO WHOLE FACE IN THE MORNING 30 DAYS EXTERNAL for 30   drospirenone-ethinyl estradiol (YASMIN) 3-0.03 MG tablet Take 1 tablet by mouth daily.   FLUoxetine (PROZAC) 20 MG capsule Take 1 capsule (20 mg total) by mouth daily.   hydroxypropyl  methylcellulose / hypromellose (ISOPTO TEARS / GONIOVISC) 2.5 % ophthalmic solution Place 1 drop into both eyes 2 (two) times daily.   ketoconazole (NIZORAL) 2 % shampoo Apply 1 application  topically 2 (two) times a week.   metroNIDAZOLE (METROCREAM) 0.75 % cream Apply 1 application topically at bedtime.   pantoprazole (PROTONIX) 40 MG tablet Take 1 tablet (40 mg total) by mouth daily before breakfast.   tretinoin (RETIN-A) 0.025 % cream Apply 1 application topically at bedtime.    [DISCONTINUED] triamcinolone (KENALOG) 0.025 % ointment Apply 1 Application topically 2 (two) times daily.   ALPRAZolam (XANAX) 0.5 MG tablet Take 1 tablet by mouth every 6 (six) hours as needed. (Patient not taking: Reported on 10/27/2022)   No facility-administered encounter medications on file as of 10/27/2022.    Allergies  Allergen Reactions   Hydrocodone Itching   Oxycodone Itching    Review of Systems As per HPI  Objective:  BP 116/71   Pulse 90   Temp 98.5 F (36.9 C)   Ht 5' 7.5" (1.715 m)   Wt 159 lb (72.1 kg)   LMP 10/18/2022 (Exact Date)   SpO2 96%   BMI 24.54 kg/m    Wt Readings from Last 3 Encounters:  09/15/22 167 lb 6.4 oz (75.9 kg)  09/08/22 167 lb (75.8 kg)  01/19/22 150 lb 3.2 oz (68.1 kg)    Physical Exam Constitutional:      General: She is awake. She is not in acute distress.    Appearance: Normal appearance. She is well-developed and well-groomed. She is not ill-appearing, toxic-appearing or diaphoretic.  HENT:     Mouth/Throat:     Lips: Pink. No lesions.     Mouth: No injury.     Dentition: Normal dentition.     Pharynx: Pharyngeal swelling, oropharyngeal exudate and posterior oropharyngeal erythema present. No uvula swelling.     Tonsils: 3+ on the right. 3+ on the left.      Comments: Multiple yellow/white exudate on left tonsil  Cardiovascular:     Rate and Rhythm: Normal rate and regular rhythm.     Pulses: Normal pulses.          Radial pulses are 2+ on the  right side and 2+ on the left side.       Posterior tibial pulses are 2+ on the right side and 2+ on the left side.     Heart sounds: Normal heart sounds. No murmur heard.    No gallop.  Pulmonary:     Effort: Pulmonary effort is normal. No respiratory distress.     Breath sounds: Normal breath sounds. No stridor. No wheezing, rhonchi or rales.  Musculoskeletal:     Cervical back: Full passive range of motion without pain and neck supple.     Right lower leg: No edema.     Left  lower leg: No edema.  Lymphadenopathy:     Head:     Right side of head: Tonsillar adenopathy present. No submental, submandibular, preauricular or posterior auricular adenopathy.     Left side of head: Tonsillar adenopathy present. No submental, submandibular, preauricular or posterior auricular adenopathy.     Cervical: No cervical adenopathy.     Right cervical: No superficial cervical adenopathy.    Left cervical: No superficial cervical adenopathy.  Skin:    General: Skin is warm.     Capillary Refill: Capillary refill takes less than 2 seconds.  Neurological:     General: No focal deficit present.     Mental Status: She is alert, oriented to person, place, and time and easily aroused. Mental status is at baseline.     GCS: GCS eye subscore is 4. GCS verbal subscore is 5. GCS motor subscore is 6.     Motor: No weakness.  Psychiatric:        Attention and Perception: Attention and perception normal.        Mood and Affect: Mood and affect normal.        Speech: Speech normal.        Behavior: Behavior normal. Behavior is cooperative.        Thought Content: Thought content normal. Thought content does not include homicidal or suicidal ideation. Thought content does not include homicidal or suicidal plan.        Cognition and Memory: Cognition and memory normal.        Judgment: Judgment normal.     Results for orders placed or performed during the hospital encounter of 09/26/22  Culture, group A strep    Specimen: Throat  Result Value Ref Range   Specimen Description      THROAT Performed at [facility], [address], [place], [place] [address]    Special Requests      NONE Performed at [facility], [address], [place], [place] [address]    Culture      NO GROUP A STREP (S.PYOGENES) ISOLATED Performed at [facility], [address], [place], [place] [address]    Report Status 09/29/2022 FINAL   SARS CORONAVIRUS 2 (TAT 6-24 HRS) Anterior Nasal Swab   Specimen: Anterior Nasal Swab  Result Value Ref Range   SARS Coronavirus 2 NEGATIVE NEGATIVE  POCT rapid strep A  Result Value Ref Range   Rapid Strep A Screen Negative Negative       01/19/2022    3:54 PM 08/25/2021    8:28 AM 03/12/2020   11:18 AM 09/21/2016    9:39 AM 06/08/2016    1:46 PM  Depression screen PHQ 2/9  Decreased Interest 0 0 0 0 0  Down, Depressed, Hopeless 0 0 0 0 0  PHQ - 2 Score 0 0 0 0 0  Altered sleeping   0    Tired, decreased energy   0    Change in appetite   0    Feeling bad or failure about yourself    0    Trouble concentrating   0    Moving slowly or fidgety/restless   0    Suicidal thoughts   0    PHQ-9 Score   0         08/25/2021    8:28 AM  GAD 7 : Generalized Anxiety Score  Nervous, Anxious, on Edge 0  Control/stop worrying 0  Worry too much - different things 0  Trouble relaxing 0  Restless 0  Easily annoyed or irritable 0  Afraid - awful might happen 0  Total GAD 7 Score 0  Anxiety Difficulty Not difficult at all    Pertinent labs & imaging results that were available during my care of the patient were reviewed by me and considered in my medical decision making.  Assessment & Plan:  Mylah was seen today for sore throat.  Diagnoses and all orders for this visit:  Recurrent streptococcal pharyngitis Referral placed as below per patient request.  Labs as below. Will communicate results to patient once available. Will await results to determine next steps.   Will prescribe medication as below for empiric coverage.  Will provide prophylaxis for yeast infection.  -     Ambulatory referral to ENT -     amoxicillin (AMOXIL) 500 MG capsule; Take 1 capsule (500 mg total) by mouth 2 (two) times daily for 10 days. -     fluconazole (DIFLUCAN) 150 MG tablet; Take 1 tablet (150 mg total) by mouth once for 1 dose. May take second tablet 72 hours after first if symptoms continue -     Rapid Strep Screen ([facility] ONLY); Future -     Culture, Group A Strep; Future -     Rapid Strep Screen ([facility] ONLY)  Tonsillar exudate As above.  -     amoxicillin (AMOXIL) 500 MG capsule; Take 1 capsule (500 mg total) by mouth 2 (two) times daily for 10 days. -     fluconazole (DIFLUCAN) 150 MG tablet; Take 1 tablet (150 mg total) by mouth once for 1 dose. May take second tablet 72 hours after first if symptoms continue -     Rapid Strep Screen ([facility] ONLY); Future  Continue all other maintenance medications.  Follow up plan: Return if symptoms worsen or fail to improve.   Continue healthy lifestyle choices, including diet (rich in fruits, vegetables, and lean proteins, and low in salt and simple carbohydrates) and exercise (at least 30 minutes of moderate physical activity daily).  Written and verbal instructions provided   The above assessment and management plan was discussed with the patient. The patient verbalized understanding of and has agreed to the management plan. Patient is aware to call the clinic if they develop any new symptoms or if symptoms persist or worsen. Patient is aware when to return to the clinic for a follow-up visit. Patient educated on when it is appropriate to go to the emergency department.   Neale Burly, DNP-FNP [facility] [address] [place], [place] [address] [phone]

## 2022-10-28 ENCOUNTER — Encounter: Payer: Self-pay | Admitting: Family Medicine

## 2022-10-30 LAB — CULTURE, GROUP A STREP

## 2022-11-01 NOTE — Progress Notes (Signed)
Positive for Beta-hemolytic colonies. Continue current medication regimen. Follow up with ENT. Referral authorized.

## 2022-11-29 ENCOUNTER — Encounter: Payer: Self-pay | Admitting: Family Medicine

## 2022-12-19 ENCOUNTER — Telehealth (INDEPENDENT_AMBULATORY_CARE_PROVIDER_SITE_OTHER): Payer: Self-pay | Admitting: Otolaryngology

## 2022-12-19 ENCOUNTER — Encounter (INDEPENDENT_AMBULATORY_CARE_PROVIDER_SITE_OTHER): Payer: Self-pay

## 2022-12-19 NOTE — Telephone Encounter (Signed)
Called patient, no answer, sent MyChart message of location.

## 2022-12-20 ENCOUNTER — Encounter (INDEPENDENT_AMBULATORY_CARE_PROVIDER_SITE_OTHER): Payer: Self-pay

## 2022-12-20 ENCOUNTER — Ambulatory Visit (INDEPENDENT_AMBULATORY_CARE_PROVIDER_SITE_OTHER): Payer: Commercial Managed Care - PPO | Admitting: Otolaryngology

## 2022-12-20 VITALS — Ht 67.0 in | Wt 159.0 lb

## 2022-12-20 DIAGNOSIS — J039 Acute tonsillitis, unspecified: Secondary | ICD-10-CM | POA: Diagnosis not present

## 2022-12-20 DIAGNOSIS — J358 Other chronic diseases of tonsils and adenoids: Secondary | ICD-10-CM

## 2022-12-20 NOTE — Progress Notes (Signed)
Dear Dr. Ellamae Sia, Here is my assessment for our mutual patient, Brianna Bridges. Thank you for allowing me the opportunity to care for your patient. Please do not hesitate to contact me should you have any other questions. Sincerely, Jovita Kussmaul, MD  Otolaryngology Clinic Note Referring provider: Dr. Ellamae Sia HPI:  Brianna Bridges is a 27 y.o. female kindly referred by Dr. Ellamae Sia for evaluation of recurrent tonsillitis.   She notes that she was having numerous episodes of tonsillitis in her late teens and early twenties. She reports that in 2019 she had 7 episodes that prompted her to see Dr. Jearld Fenton, who recommended tonsillectomy but she decided to wait.  She reports some improvement in the recurrent issues but they persist. Over the last three years she has had 3 infections per year. The most recent episode of strep pharyngitis was in August/ September of this year where she was diagnosed with Group A strep, which was successfully treated with amoxicillin. When she does have the infections they present with fever, throat pain, swelling, and exudate. She also has tonsil stones.  She reports no major issues with antibiotics other than yeast infections.   No PTAs prior.  She reports a history of frequent ear infections as a child, she had tympanostomy tube placement which resolved the issue. No history of nasal obstruction or seasonal allergies. No difficulty swallowing. She does not that she has issues with ear popping and pressure/pain in her ears when changing altitude or when swallowing. No significant hearing problems. She lives alone and has no children. She works as a Child psychotherapist at [employer]. No smoker exposure.   Patient denies: ear pain, fullness, vertigo, drainage. Patient additionally denies: deep pain in ear canal Patient also denies barotrauma, vestibular suppressant use, ototoxic medication use Prior ear surgery: BTTs  H&N Surgery: Wisdom teeth extraction,  cholecystectomy, tympanostomy tubes, eye surgery Personal or FHx of bleeding dz or anesthesia difficulty: no   GLP-1: no AP/AC: no  Tobacco: no. Alcohol: rare. 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 Lives in [place] and Dad.   Independent Review of Additional Tests or Records:  Previous Office note from Dr. Jearld Fenton on 11/09/2017 Multiple Strep tests (2023 and 2024): intermittent but multiple positives, recent Strep B pos  PMH/Meds/All/SocHx/FamHx/ROS:   Past Medical History:  Diagnosis Date   Abdominal pain, chronic, right lower quadrant 01/29/2019   Abnormal LFTs 11/05/2018   Food intolerance    GERD (gastroesophageal reflux disease)    Hepatitis A    IBS (irritable bowel syndrome)    Polycystic ovary syndrome    Rectal bleeding 04/23/2020   Recurrent tonsillitis 05/24/2017     Past Surgical History:  Procedure Laterality Date   BIOPSY  05/06/2020   Procedure: BIOPSY;  Surgeon: Malissa Hippo, MD;  [facility];  Service: Endoscopy;;  duodenal, random colon   CHOLECYSTECTOMY N/A 01/16/2019   Procedure: LAPAROSCOPIC CHOLECYSTECTOMY;  Surgeon: Lucretia Roers, MD;  Location: [facility];  Service: General;  Laterality: N/A;   COLONOSCOPY WITH PROPOFOL N/A 05/06/2020   Procedure: COLONOSCOPY WITH PROPOFOL;  Surgeon: Malissa Hippo, MD;  [facility];  Service: Endoscopy;  Laterality: N/A;   ESOPHAGOGASTRODUODENOSCOPY (EGD) WITH PROPOFOL N/A 05/06/2020   Procedure: ESOPHAGOGASTRODUODENOSCOPY (EGD) WITH PROPOFOL;  Surgeon: Malissa Hippo, MD;  [facility];  Service: Endoscopy;  Laterality: N/A;  AM   EYE SURGERY Bilateral    Foot surgery Left    Plantars wart   LIVER BIOPSY N/A 01/16/2019  Procedure: LIVER BIOPSY;  Surgeon: Lucretia Roers, MD;  Location: [facility];  Service: General;  Laterality: N/A;   WISDOM TOOTH EXTRACTION      Family History  Problem Relation Age of Onset   GER disease Mother    Hyperlipidemia Father    High Cholesterol Father     Healthy Sister      Social Connections: Not on file      Current Outpatient Medications:    ALPRAZolam (XANAX) 0.5 MG tablet, Take 1 tablet by mouth every 6 (six) hours as needed. (Patient not taking: Reported on 10/27/2022), Disp: , Rfl:    clindamycin (CLEOCIN T) 1 % lotion, APPLY AS DIRECTED EXTERNALLY PEA SIZE AMOUNT TO WHOLE FACE IN THE MORNING 30 DAYS EXTERNAL for 30, Disp: , Rfl:    drospirenone-ethinyl estradiol (YASMIN) 3-0.03 MG tablet, Take 1 tablet by mouth daily., Disp: 84 tablet, Rfl: 3   FLUoxetine (PROZAC) 20 MG capsule, Take 1 capsule (20 mg total) by mouth daily., Disp: 90 capsule, Rfl: 3   hydroxypropyl methylcellulose / hypromellose (ISOPTO TEARS / GONIOVISC) 2.5 % ophthalmic solution, Place 1 drop into both eyes 2 (two) times daily., Disp: , Rfl:    ketoconazole (NIZORAL) 2 % shampoo, Apply 1 application  topically 2 (two) times a week., Disp: , Rfl:    metroNIDAZOLE (METROCREAM) 0.75 % cream, Apply 1 application topically at bedtime., Disp: , Rfl:    pantoprazole (PROTONIX) 40 MG tablet, Take 1 tablet (40 mg total) by mouth daily before breakfast., Disp: 30 tablet, Rfl: 2   tretinoin (RETIN-A) 0.025 % cream, Apply 1 application topically at bedtime. , Disp: , Rfl:    Physical Exam:   There were no vitals taken for this visit.  Pertinent Findings  CN II-XII intact  Bilateral EAC clear and TM intact with well aerated middle ear- tympanosclerosis posteriorly bilaterally, and anteriorly on left [address]: unclear [address] 512: AC > BC b/l  Anterior rhinoscopy: Septum midline; bilateral inferior turbinates without significant hypertrophy or obvious polyps No lesions of oral cavity/oropharynx; dentition WNL No obviously palpable neck masses/lymphadenopathy/thyromegaly Tonsills 1+ with no exudate or inflammation, no significant stones No respiratory distress or stridor  Seprately Identifiable Procedures:  None  Impression & Plans:  Brianna Bridges is a 27 y.o.  female with: Recurrent tonsillitis Tonsilloliths We discussed management for this, including observation, antibiotics, and tonsillectomy.  We discussed R/B/A for Tonsillectomy including significant post-op pain, bleeding (3%, including life threatening bleeding and requiring return to OR), and infections (still with pharyngitis) as well as persistent symptoms and risk of anesthesia. We also discussed post-op management and risks.   Given her frequent infections, she is a candidate for tonsillectomy. She would like to think about this. She'll call if she wishes to proceed Advised good oral care with salt water gargles and dental care    Thank you for allowing me the opportunity to care for your patient. Please do not hesitate to contact me should you have any other questions.  Sincerely, Jovita Kussmaul, MD [facility] Phone: [phone] Fax: [phone]  12/20/2022, 8:31 AM    I have personally spent 51 minutes involved in face-to-face and non-face-to-face activities for this patient on the day of the visit.  Professional time spent includes the following activities, in addition to those noted in the documentation: preparing to see the patient (review of outside documentation and results), performing a medically appropriate examination and/or evaluation, counseling and educating the patient/family/caregiver, ordering medications, referring and communicating with other  healthcare professionals, documenting clinical information in the electronic or other health record, independently interpreting results and communicating results with the patient

## 2023-01-17 ENCOUNTER — Other Ambulatory Visit (HOSPITAL_COMMUNITY): Payer: Self-pay

## 2023-01-18 ENCOUNTER — Other Ambulatory Visit: Payer: Self-pay

## 2023-01-18 ENCOUNTER — Other Ambulatory Visit (HOSPITAL_COMMUNITY): Payer: Self-pay

## 2023-01-18 MED ORDER — FLUOXETINE HCL 20 MG PO CAPS
20.0000 mg | ORAL_CAPSULE | Freq: Every day | ORAL | 0 refills | Status: DC
  Filled 2023-01-18: qty 90, 90d supply, fill #0

## 2023-02-06 ENCOUNTER — Telehealth (INDEPENDENT_AMBULATORY_CARE_PROVIDER_SITE_OTHER): Payer: Commercial Managed Care - PPO | Admitting: Family

## 2023-02-06 ENCOUNTER — Encounter: Payer: Self-pay | Admitting: Family

## 2023-02-06 DIAGNOSIS — R509 Fever, unspecified: Secondary | ICD-10-CM

## 2023-02-06 DIAGNOSIS — R0989 Other specified symptoms and signs involving the circulatory and respiratory systems: Secondary | ICD-10-CM | POA: Diagnosis not present

## 2023-02-06 DIAGNOSIS — J029 Acute pharyngitis, unspecified: Secondary | ICD-10-CM | POA: Diagnosis not present

## 2023-02-06 LAB — CULTURE, GROUP A STREP

## 2023-02-06 LAB — VERITOR FLU A/B WAIVED
Influenza A: NEGATIVE
Influenza B: NEGATIVE

## 2023-02-06 LAB — RAPID STREP SCREEN (MED CTR MEBANE ONLY): Strep Gp A Ag, IA W/Reflex: NEGATIVE

## 2023-02-06 MED ORDER — FLUCONAZOLE 150 MG PO TABS: 150.0000 mg | ORAL_TABLET | ORAL | 0 refills | Status: DC | PRN

## 2023-02-06 MED ORDER — AMOXICILLIN 500 MG PO CAPS
500.0000 mg | ORAL_CAPSULE | Freq: Two times a day (BID) | ORAL | 0 refills | Status: AC
Start: 2023-02-06 — End: 2023-02-16

## 2023-02-06 NOTE — Progress Notes (Signed)
Virtual Visit Consent   Brianna Bridges, you are scheduled for a virtual visit with a [facility] provider today. Just as with appointments in the office, your consent must be obtained to participate. Your consent will be active for this visit and any virtual visit you may have with one of our providers in the next 365 days. If you have a MyChart account, a copy of this consent can be sent to you electronically.  As this is a virtual visit, video technology does not allow for your provider to perform a traditional examination. This may limit your provider's ability to fully assess your condition. If your provider identifies any concerns that need to be evaluated in person or the need to arrange testing (such as labs, EKG, etc.), we will make arrangements to do so. Although advances in technology are sophisticated, we cannot ensure that it will always work on either your end or our end. If the connection with a video visit is poor, the visit may have to be switched to a telephone visit. With either a video or telephone visit, we are not always able to ensure that we have a secure connection.  By engaging in this virtual visit, you consent to the provision of healthcare and authorize for your insurance to be billed (if applicable) for the services provided during this visit. Depending on your insurance coverage, you may receive a charge related to this service.  I need to obtain your verbal consent now. Are you willing to proceed with your visit today? Brianna Bridges has provided verbal consent on 02/06/2023 for a virtual visit (video or telephone). Brianna Rodney, FNP  Date: 02/06/2023 12:54 PM  Virtual Visit via Video Note   I, Brianna Bridges, connected with  Brianna Bridges  ([id number], [birth year]) on 02/06/23 at  4:45 PM EST by a video-enabled telemedicine application and verified that I am speaking with the correct person using two identifiers.  Location: Patient: Virtual Visit Location  Patient: Home Provider: Virtual Visit Location Provider: Home Office   I discussed the limitations of evaluation and management by telemedicine and the availability of in person appointments. The patient expressed understanding and agreed to proceed.    History of Present Illness: Brianna Bridges is a 27 y.o. who identifies as a female who was assigned female at birth, and is being seen today for sore throat that started 5 days ago.  HPI: Sore Throat  This is a new problem. The current episode started in the past 7 days. The problem has been gradually worsening. The pain is worse on the left side. Maximum temperature: 16F. The pain is at a severity of 6/10. The pain is moderate. Associated symptoms include congestion, coughing, ear pain, headaches and swollen glands. Pertinent negatives include no trouble swallowing. Associated symptoms comments: Sinus pressure. She has tried acetaminophen and NSAIDs for the symptoms. The treatment provided mild relief.    Problems:  Patient Active Problem List   Diagnosis Date Noted   Finger pain, right 09/03/2021   Anal fissure 04/27/2021   GERD (gastroesophageal reflux disease) 10/22/2019   Anal skin tag 03/04/2019   IBS (irritable bowel syndrome)    Calculus of gallbladder without cholecystitis without obstruction 12/04/2018    Allergies:  Allergies  Allergen Reactions   Hydrocodone Itching   Oxycodone Itching   Medications:  Current Outpatient Medications:    ALPRAZolam (XANAX) 0.5 MG tablet, Take 1 tablet by mouth every 6 (six) hours as needed., Disp: , Rfl:  clindamycin (CLEOCIN T) 1 % lotion, APPLY AS DIRECTED EXTERNALLY PEA SIZE AMOUNT TO WHOLE FACE IN THE MORNING 30 DAYS EXTERNAL for 30, Disp: , Rfl:    drospirenone-ethinyl estradiol (YASMIN) 3-0.03 MG tablet, Take 1 tablet by mouth daily., Disp: 84 tablet, Rfl: 3   FLUoxetine (PROZAC) 20 MG capsule, Take 1 capsule (20 mg total) by mouth daily., Disp: 90 capsule, Rfl: 0   hydroxypropyl  methylcellulose / hypromellose (ISOPTO TEARS / GONIOVISC) 2.5 % ophthalmic solution, Place 1 drop into both eyes 2 (two) times daily., Disp: , Rfl:    ketoconazole (NIZORAL) 2 % shampoo, Apply 1 application  topically 2 (two) times a week., Disp: , Rfl:    metroNIDAZOLE (METROCREAM) 0.75 % cream, Apply 1 application topically at bedtime., Disp: , Rfl:    pantoprazole (PROTONIX) 40 MG tablet, Take 1 tablet (40 mg total) by mouth daily before breakfast., Disp: 30 tablet, Rfl: 2   tretinoin (RETIN-A) 0.025 % cream, Apply 1 application topically at bedtime. , Disp: , Rfl:   Observations/Objective: Patient is well-developed, well-nourished in no acute distress.  Resting comfortably at home.  Head is normocephalic, atraumatic.  No labored breathing.  Speech is clear and coherent with logical content.  Patient is alert and oriented at baseline.  Throat Erythemas  Nasal congestion  Assessment and Plan: 1. Sore throat (Primary) - Rapid Strep Screen ([facility] ONLY) - Veritor Flu A/B Waived - Novel Coronavirus, NAA ([facility]); Future - Novel Coronavirus, NAA ([facility])  2. Fever and chills - Rapid Strep Screen ([facility] ONLY) - Veritor Flu A/B Waived - Novel Coronavirus, NAA ([facility]); Future - Novel Coronavirus, NAA ([facility])  3. Chest congestion - Veritor Flu A/B Waived - Novel Coronavirus, NAA ([facility]); Future - Novel Coronavirus, NAA ([facility])  Given hx of strep throat, will give amoxicillin 500 mg BID. She will hold off on starting for a few days. If worsens she will start amoxicillin.   - Take meds as prescribed - Use a cool mist humidifier  -Use saline nose sprays frequently -Force fluids -For any cough or congestion  Use plain Mucinex- regular strength or max strength is fine -For fever or aces or pains- take tylenol or ibuprofen. -Throat lozenges if help -New toothbrush in 3 days Follow up if symptoms worsen or do not improve   Follow Up Instructions: I  discussed the assessment and treatment plan with the patient. The patient was provided an opportunity to ask questions and all were answered. The patient agreed with the plan and demonstrated an understanding of the instructions.  A copy of instructions were sent to the patient via MyChart unless otherwise noted below.     The patient was advised to call back or seek an in-person evaluation if the symptoms worsen or if the condition fails to improve as anticipated.    Brianna Rodney, FNP

## 2023-02-07 ENCOUNTER — Ambulatory Visit: Payer: Commercial Managed Care - PPO

## 2023-02-07 LAB — NOVEL CORONAVIRUS, NAA: SARS-CoV-2, NAA: DETECTED — AB

## 2023-02-09 MED ORDER — BENZONATATE 200 MG PO CAPS: 200.0000 mg | ORAL_CAPSULE | Freq: Three times a day (TID) | ORAL | 1 refills | Status: DC | PRN

## 2023-02-09 MED ORDER — PROMETHAZINE-DM 6.25-15 MG/5ML PO SYRP: 5.0000 mL | ORAL_SOLUTION | Freq: Three times a day (TID) | ORAL | 0 refills | Status: DC | PRN

## 2023-02-09 NOTE — Addendum Note (Signed)
 Addended by: Jannifer Rodney A on: 02/09/2023 01:53 PM   Modules accepted: Orders

## 2023-04-03 ENCOUNTER — Ambulatory Visit: Payer: Commercial Managed Care - PPO | Admitting: Internal Medicine

## 2023-04-03 DIAGNOSIS — L814 Other melanin hyperpigmentation: Secondary | ICD-10-CM | POA: Diagnosis not present

## 2023-04-03 DIAGNOSIS — D485 Neoplasm of uncertain behavior of skin: Secondary | ICD-10-CM | POA: Diagnosis not present

## 2023-04-03 DIAGNOSIS — D225 Melanocytic nevi of trunk: Secondary | ICD-10-CM | POA: Diagnosis not present

## 2023-04-03 DIAGNOSIS — L219 Seborrheic dermatitis, unspecified: Secondary | ICD-10-CM | POA: Diagnosis not present

## 2023-04-03 DIAGNOSIS — L578 Other skin changes due to chronic exposure to nonionizing radiation: Secondary | ICD-10-CM | POA: Diagnosis not present

## 2023-04-03 DIAGNOSIS — D229 Melanocytic nevi, unspecified: Secondary | ICD-10-CM | POA: Diagnosis not present

## 2023-05-01 ENCOUNTER — Encounter: Payer: Self-pay | Admitting: Family Medicine

## 2023-05-05 ENCOUNTER — Ambulatory Visit

## 2023-05-05 ENCOUNTER — Other Ambulatory Visit

## 2023-05-05 ENCOUNTER — Encounter: Payer: Self-pay | Admitting: Family Medicine

## 2023-05-05 ENCOUNTER — Other Ambulatory Visit: Payer: Self-pay

## 2023-05-05 VITALS — BP 134/88 | HR 86 | Temp 98.0°F | Ht 67.0 in | Wt 160.6 lb

## 2023-05-05 DIAGNOSIS — R002 Palpitations: Secondary | ICD-10-CM

## 2023-05-05 NOTE — Progress Notes (Signed)
 Subjective: NG:EXBMW palpitations PCP: Raliegh Ip, DO [id number] Brianna Bridges is a 28 y.o. female presenting to clinic today for:  1. Heart palpitations She reports that she has been having intermittent and random heart palpitations for at least 6 to 7 months.  Sometimes this will be associated with shortness of breath and her heart rate has gone well into the 100s.  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  She does report daily coffee intake but this is not unusual for her.  She occasionally will drink a Celsius drink but again this is rare.  She denies any changes in medications.  She denies any use of supplements OTC.  No use of illicit substances.  She reports no lower extremity swelling.  No chest pain.  No hemoptysis.  Her last menstrual cycle is now.  She has had slight spacing of the menstrual cycles but really no other changes.   ROS: Per HPI  Allergies  Allergen Reactions   Hydrocodone Itching   Oxycodone Itching   Past Medical History:  Diagnosis Date   Abdominal pain, chronic, right lower quadrant 01/29/2019   Abnormal LFTs 11/05/2018   Food intolerance    GERD (gastroesophageal reflux disease)    Hepatitis A    IBS (irritable bowel syndrome)    Polycystic ovary syndrome    Rectal bleeding 04/23/2020   Recurrent tonsillitis 05/24/2017    Current Outpatient Medications:    ALPRAZolam (XANAX) 0.5 MG tablet, Take 1 tablet by mouth every 6 (six) hours as needed., Disp: , Rfl:    benzonatate (TESSALON) 200 MG capsule, Take 1 capsule (200 mg total) by mouth 3 (three) times daily as needed., Disp: 30 capsule, Rfl: 1   clindamycin (CLEOCIN T) 1 % lotion, APPLY AS DIRECTED EXTERNALLY PEA SIZE AMOUNT TO WHOLE FACE IN THE MORNING 30 DAYS EXTERNAL for 30, Disp: , Rfl:    drospirenone-ethinyl estradiol (YASMIN) 3-0.03 MG tablet, Take 1 tablet by mouth daily., Disp: 84 tablet, Rfl: 3   fluconazole (DIFLUCAN)  150 MG tablet, Take 1 tablet (150 mg total) by mouth every three (3) days as needed., Disp: 2 tablet, Rfl: 0   FLUoxetine (PROZAC) 20 MG capsule, Take 1 capsule (20 mg total) by mouth daily., Disp: 90 capsule, Rfl: 0   hydroxypropyl methylcellulose / hypromellose (ISOPTO TEARS / GONIOVISC) 2.5 % ophthalmic solution, Place 1 drop into both eyes 2 (two) times daily., Disp: , Rfl:    ketoconazole (NIZORAL) 2 % shampoo, Apply 1 application  topically 2 (two) times a week., Disp: , Rfl:    metroNIDAZOLE (METROCREAM) 0.75 % cream, Apply 1 application topically at bedtime., Disp: , Rfl:    pantoprazole (PROTONIX) 40 MG tablet, Take 1 tablet (40 mg total) by mouth daily before breakfast., Disp: 30 tablet, Rfl: 2   promethazine-dextromethorphan (PROMETHAZINE-DM) 6.25-15 MG/5ML syrup, Take 5 mLs by mouth 3 (three) times daily as needed for cough., Disp: 118 mL, Rfl: 0   tretinoin (RETIN-A) 0.025 % cream, Apply 1 application topically at bedtime. , Disp: , Rfl:  Social History   Socioeconomic History   Marital status: Single    Spouse name: Not on file   Number of children: Not on file   Years of education: Not on file   Highest education level: Not on file  Occupational History   Not on file  Tobacco Use   Smoking status: Never    Passive exposure: Never   Smokeless  tobacco: Never  Vaping Use   Vaping status: Never Used  Substance and Sexual Activity   Alcohol use: Not Currently   Drug use: No   Sexual activity: Yes    Birth control/protection: Pill  Other Topics Concern   Not on file  Social History Narrative   Not on file   Social Drivers of Health   Financial Resource Strain: Not on file  Food Insecurity: Not on file  Transportation Needs: Not on file  Physical Activity: Not on file  Stress: Not on file  Social Connections: Not on file  Intimate Partner Violence: Not on file   Family History  Problem Relation Age of Onset   GER disease Mother    Hyperlipidemia Father    High  Cholesterol Father    Healthy Sister     Objective: Office vital signs reviewed. BP 134/88   Pulse 86   Temp 98 F (36.7 C)   Ht 5\' 7"  (1.702 m)   Wt 160 lb 9.6 oz (72.8 kg)   LMP 05/02/2023   SpO2 96%   BMI 25.15 kg/m   Physical Examination:  General: Awake, alert, well nourished, No acute distress HEENT: No exophthalmos.  No goiter Cardio: regular rate and rhythm, S1S2 heard, no murmurs appreciated Pulm: clear to auscultation bilaterally, no wheezes, rhonchi or rales; normal work of breathing on room air  Assessment/ Plan: 29 y.o. female   Heart palpitations - Plan: EKG 12-Lead, LONG TERM MONITOR (3-14 DAYS), TSH + free T4, CMP14+EGFR, CBC, Magnesium  Metabolic workup to rule out metabolic causes of intermittent heart palpitations.  She had a normal cardiac exam today and I could not appreciate any skipped or extra beats.  Her EKG was unremarkable.  Check for electrolyte disturbance.  Holter monitor placed on her today and will evaluate for any abnormalities there and refer to cardiology pending that assessment.   Raliegh Ip, DO [facility] [phone]

## 2023-05-05 NOTE — Patient Instructions (Signed)
Palpitations Palpitations are feelings that your heartbeat is irregular or is faster than normal. It may feel like your heart is fluttering or skipping a beat. Palpitations may be caused by many things, including smoking, caffeine, alcohol, stress, and certain medicines or drugs. Most causes of palpitations are not serious.  However, some palpitations can be a sign of a serious problem. Further tests and a thorough medical history will be done to find the cause of your palpitations. Your provider may order tests such as an ECG, labs, an echocardiogram, or an ambulatory continuous ECG monitor. Follow these instructions at home: Pay attention to any changes in your symptoms. Let your health care provider know about them. Take these actions to help manage your symptoms: Eating and drinking Follow instructions from your health care provider about eating or drinking restrictions. You may need to avoid foods and drinks that may cause palpitations. These may include: Caffeinated coffee, tea, soft drinks, and energy drinks. Chocolate. Alcohol. Diet pills. Lifestyle     Take steps to reduce your stress and anxiety. Things that can help you relax include: Yoga. Mind-body activities, such as deep breathing, meditation, or using words and images to create positive thoughts (guided imagery). Physical activity, such as swimming, jogging, or walking. Tell your health care provider if your palpitations increase with activity. If you have chest pain or shortness of breath with activity, do not continue the activity until you are seen by your health care provider. Biofeedback. This is a method that helps you learn to use your mind to control things in your body, such as your heartbeat. Get plenty of rest and sleep. Keep a regular bed time. Do not use drugs, including cocaine or ecstasy. Do not use marijuana. Do not use any products that contain nicotine or tobacco. These products include cigarettes, chewing  tobacco, and vaping devices, such as e-cigarettes. If you need help quitting, ask your health care provider. General instructions Take over-the-counter and prescription medicines only as told by your health care provider. Keep all follow-up visits. This is important. These may include visits for further testing if palpitations do not go away or get worse. Contact a health care provider if: You continue to have a fast or irregular heartbeat for a long period of time. You notice that your palpitations occur more often. Get help right away if: You have chest pain or shortness of breath. You have a severe headache. You feel dizzy or you faint. These symptoms may represent a serious problem that is an emergency. Do not wait to see if the symptoms will go away. Get medical help right away. Call your local emergency services (911 in the [place] Do not drive yourself to the hospital. Summary Palpitations are feelings that your heartbeat is irregular or is faster than normal. It may feel like your heart is fluttering or skipping a beat. Palpitations may be caused by many things, including smoking, caffeine, alcohol, stress, certain medicines, and drugs. Further tests and a thorough medical history may be done to find the cause of your palpitations. Get help right away if you faint or have chest pain, shortness of breath, severe headache, or dizziness. This information is not intended to replace advice given to you by your health care provider. Make sure you discuss any questions you have with your health care provider. Document Revised: 06/17/2020 Document Reviewed: 06/17/2020 Elsevier Patient Education  2024 [employer].

## 2023-05-07 LAB — CMP14+EGFR
ALT: 15 IU/L (ref 0–32)
AST: 16 IU/L (ref 0–40)
Albumin: 4.3 g/dL (ref 4.0–5.0)
Alkaline Phosphatase: 35 IU/L — ABNORMAL LOW (ref 44–121)
BUN/Creatinine Ratio: 14 (ref 9–23)
BUN: 12 mg/dL (ref 6–20)
Bilirubin Total: 0.4 mg/dL (ref 0.0–1.2)
CO2: 25 mmol/L (ref 20–29)
Calcium: 9.6 mg/dL (ref 8.7–10.2)
Chloride: 102 mmol/L (ref 96–106)
Creatinine, Ser: 0.84 mg/dL (ref 0.57–1.00)
Globulin, Total: 2.4 g/dL (ref 1.5–4.5)
Glucose: 78 mg/dL (ref 70–99)
Potassium: 4.6 mmol/L (ref 3.5–5.2)
Sodium: 139 mmol/L (ref 134–144)
Total Protein: 6.7 g/dL (ref 6.0–8.5)
eGFR: 98 mL/min/{1.73_m2} (ref >59)

## 2023-05-07 LAB — TSH+FREE T4
Free T4: 1.42 ng/dL (ref 0.82–1.77)
TSH: 2.44 u[IU]/mL (ref 0.450–4.500)

## 2023-05-07 LAB — MAGNESIUM: Magnesium: 2 mg/dL (ref 1.6–2.3)

## 2023-05-07 LAB — CBC
Hematocrit: 43.7 % (ref 34.0–46.6)
Hemoglobin: 14.5 g/dL (ref 11.1–15.9)
MCH: 28.9 pg (ref 26.6–33.0)
MCHC: 33.2 g/dL (ref 31.5–35.7)
MCV: 87 fL (ref 79–97)
Platelets: 292 10*3/uL (ref 150–450)
RBC: 5.02 x10E6/uL (ref 3.77–5.28)
RDW: 12.3 % (ref 11.7–15.4)
WBC: 5.5 10*3/uL (ref 3.4–10.8)

## 2023-05-08 ENCOUNTER — Encounter: Payer: Self-pay | Admitting: Family Medicine

## 2023-05-18 DIAGNOSIS — Z124 Encounter for screening for malignant neoplasm of cervix: Secondary | ICD-10-CM | POA: Diagnosis not present

## 2023-05-18 DIAGNOSIS — Z6826 Body mass index (BMI) 26.0-26.9, adult: Secondary | ICD-10-CM | POA: Diagnosis not present

## 2023-05-18 DIAGNOSIS — Z01419 Encounter for gynecological examination (general) (routine) without abnormal findings: Secondary | ICD-10-CM | POA: Diagnosis not present

## 2023-05-18 DIAGNOSIS — F419 Anxiety disorder, unspecified: Secondary | ICD-10-CM | POA: Diagnosis not present

## 2023-05-25 DIAGNOSIS — R002 Palpitations: Secondary | ICD-10-CM | POA: Diagnosis not present

## 2023-05-30 ENCOUNTER — Encounter: Payer: Self-pay | Admitting: Family Medicine

## 2023-06-25 ENCOUNTER — Other Ambulatory Visit (HOSPITAL_COMMUNITY): Payer: Self-pay

## 2023-06-26 ENCOUNTER — Other Ambulatory Visit (HOSPITAL_COMMUNITY): Payer: Self-pay

## 2023-06-26 ENCOUNTER — Other Ambulatory Visit: Payer: Self-pay

## 2023-06-26 MED ORDER — FLUOXETINE HCL 20 MG PO CAPS
20.0000 mg | ORAL_CAPSULE | Freq: Every day | ORAL | 3 refills | Status: AC
  Filled 2023-06-26: qty 90, 90d supply, fill #0
  Filled 2023-10-08: qty 90, 90d supply, fill #1
  Filled 2024-02-08: qty 90, 90d supply, fill #2

## 2023-07-02 ENCOUNTER — Encounter (INDEPENDENT_AMBULATORY_CARE_PROVIDER_SITE_OTHER): Payer: Self-pay | Admitting: Family Medicine

## 2023-07-02 DIAGNOSIS — J301 Allergic rhinitis due to pollen: Secondary | ICD-10-CM | POA: Diagnosis not present

## 2023-07-04 MED ORDER — FLUTICASONE PROPIONATE 50 MCG/ACT NA SUSP: 2.0000 | Freq: Every day | NASAL | 6 refills | Status: DC

## 2023-07-04 MED ORDER — MONTELUKAST SODIUM 10 MG PO TABS
10.0000 mg | ORAL_TABLET | Freq: Every day | ORAL | 3 refills | Status: DC
Start: 2023-07-04 — End: 2023-09-27
  Filled 2023-08-05: qty 90, 90d supply, fill #0

## 2023-07-04 NOTE — Telephone Encounter (Signed)

## 2023-08-04 ENCOUNTER — Other Ambulatory Visit (HOSPITAL_COMMUNITY): Payer: Self-pay

## 2023-08-05 ENCOUNTER — Other Ambulatory Visit (HOSPITAL_COMMUNITY): Payer: Self-pay

## 2023-08-24 ENCOUNTER — Telehealth: Payer: Self-pay | Admitting: Family Medicine

## 2023-08-24 NOTE — Telephone Encounter (Signed)
 Is it ok to schedule pt on 09/28/2023? Pt is a [employer] employee and ntbs before Sept 1, 2025 due to insurance.   Copied from CRM [id number]. Topic: Appointments - Scheduling Inquiry for Clinic >> Aug 24, 2023  8:11 AM Larissa RAMAN wrote: Reason for CRM: Patient wants to schedule appointment for Physical, however her PCP shows no availability until September 2. Patient declined appointment and states she needs her physical before September 1. Advised patient of rule that physical must be scheduled with PCP. Patient states she will go to clinic to self schedule and disconnected call.    ----------------------------------------------------------------------- From previous Reason for Contact - Scheduling: Patient/patient representative is calling to schedule an appointment. Refer to attachments for appointment information.

## 2023-08-25 NOTE — Telephone Encounter (Signed)
 Reached out to pt on mychart to get a apt

## 2023-09-05 ENCOUNTER — Ambulatory Visit

## 2023-09-27 ENCOUNTER — Ambulatory Visit: Admitting: Family Medicine

## 2023-09-27 ENCOUNTER — Encounter: Payer: Self-pay | Admitting: Family Medicine

## 2023-09-27 ENCOUNTER — Other Ambulatory Visit (HOSPITAL_COMMUNITY): Payer: Self-pay

## 2023-09-27 VITALS — BP 115/79 | HR 88 | Temp 98.2°F | Ht 68.0 in | Wt 164.4 lb

## 2023-09-27 DIAGNOSIS — Z0001 Encounter for general adult medical examination with abnormal findings: Secondary | ICD-10-CM | POA: Diagnosis not present

## 2023-09-27 DIAGNOSIS — J301 Allergic rhinitis due to pollen: Secondary | ICD-10-CM | POA: Diagnosis not present

## 2023-09-27 DIAGNOSIS — Z0000 Encounter for general adult medical examination without abnormal findings: Secondary | ICD-10-CM

## 2023-09-27 MED ORDER — MONTELUKAST SODIUM 10 MG PO TABS
10.0000 mg | ORAL_TABLET | Freq: Every day | ORAL | 3 refills | Status: AC
  Filled 2023-09-27 – 2023-10-16 (×3): qty 90, 90d supply, fill #0
  Filled 2024-02-08: qty 90, 90d supply, fill #1

## 2023-09-27 NOTE — Progress Notes (Signed)
 Brianna Bridges is a 28 y.o. female presents to office today for annual physical exam examination.    Discussed the use of AI scribe software for clinical note transcription with the patient, who gave verbal consent to proceed.  History of Present Illness   Brianna Bridges is a 28 year old female who presents for medication management and routine follow-up.  She is currently doing well with no major medical changes since her last visit. She reports no new symptoms or concerns.  Her current medications include Singulair , which has significantly improved her symptoms, allowing her to discontinue Flonase . She also takes Fluoxetine  20 mg daily, which she finds effective. Her dermatological regimen includes clindamycin lotion, Yasmin , Metro cream, and Retin-A . She has stopped using ketoconazole shampoo.  No major vision changes, hearing problems, difficulty swallowing, chest pain, shortness of breath, palpitations, breast concerns, nausea, vomiting, abdominal pain, changes in stool, rectal bleeding, or abnormal vaginal bleeding. Menstrual cycles are regular. No new skin issues, numbness, tingling, or balance issues. She is sexually active with no pelvic concerns and has reduced her intake of energy drinks, which has helped with palpitations.  She works as a Child psychotherapist at [employer] and has no changes in her family or social situation. She recently spent time with her nephew and sister, who visited for eight to nine weeks.      There are no preventive care reminders to display for this patient. Refills needed today: all  Immunization History  Administered Date(s) Administered   DTaP 04/15/1996, 06/17/1996, 08/19/1996, 05/14/1997, 04/12/2001   HIB (PRP-OMP) 04/15/1996, 06/17/1996, 08/19/1996, 02/21/1997   HPV Quadrivalent 05/23/2011, 01/17/2012, 05/15/2012   Hepatitis A 05/23/2011, 01/17/2012   Hepatitis A, Adult 05/23/2011, 01/17/2012   Hepatitis B April 07, 1996, 03/18/1996, 08/19/1996    Hepatitis B, ADULT 09/18/2015   Hepatitis B, PED/ADOLESCENT 1996-11-14, 03/18/1996, 08/19/1996, 01/02/2015, 04/14/2015   Hpv-Unspecified 05/23/2011, 01/17/2012, 05/15/2012   IPV 04/15/1996, 06/17/1996, 08/19/1996, 04/12/2001   Influenza,inj,Quad PF,6+ Mos 12/08/2017, 12/04/2019   Influenza-Unspecified 10/08/2021   MMR 05/14/1997, 04/12/2001   Meningococcal Conjugate 04/17/2012   PPD Test 06/15/2015, 06/27/2016, 09/18/2017   Td 09/11/2007, 09/18/2017   Tdap 09/11/2007, 09/18/2017   Past Medical History:  Diagnosis Date   Abdominal pain, chronic, right lower quadrant 01/29/2019   Abnormal LFTs 11/05/2018   Food intolerance    GERD (gastroesophageal reflux disease)    Hepatitis A    IBS (irritable bowel syndrome)    Polycystic ovary syndrome    Rectal bleeding 04/23/2020   Recurrent tonsillitis 05/24/2017   Social History   Socioeconomic History   Marital status: Single    Spouse name: Not on file   Number of children: Not on file   Years of education: Not on file   Highest education level: Not on file  Occupational History   Not on file  Tobacco Use   Smoking status: Never    Passive exposure: Never   Smokeless tobacco: Never  Vaping Use   Vaping status: Never Used  Substance and Sexual Activity   Alcohol use: Not Currently   Drug use: No   Sexual activity: Yes    Birth control/protection: Pill  Other Topics Concern   Not on file  Social History Narrative   Not on file   Social Drivers of Health   Financial Resource Strain: Not on file  Food Insecurity: Not on file  Transportation Needs: Not on file  Physical Activity: Not on file  Stress: Not on file  Social Connections: Not on  file  Intimate Partner Violence: Not on file   Past Surgical History:  Procedure Laterality Date   BIOPSY  05/06/2020   Procedure: BIOPSY;  Surgeon: Golda Claudis PENNER, MD;  Location: [facility];  Service: Endoscopy;;  duodenal, random colon   CHOLECYSTECTOMY N/A 01/16/2019    Procedure: LAPAROSCOPIC CHOLECYSTECTOMY;  Surgeon: Kallie Manuelita BROCKS, MD;  Location: [facility];  Service: General;  Laterality: N/A;   COLONOSCOPY WITH PROPOFOL  N/A 05/06/2020   Procedure: COLONOSCOPY WITH PROPOFOL ;  Surgeon: Golda Claudis PENNER, MD;  Location: [facility];  Service: Endoscopy;  Laterality: N/A;   ESOPHAGOGASTRODUODENOSCOPY (EGD) WITH PROPOFOL  N/A 05/06/2020   Procedure: ESOPHAGOGASTRODUODENOSCOPY (EGD) WITH PROPOFOL ;  Surgeon: Golda Claudis PENNER, MD;  Location: [facility];  Service: Endoscopy;  Laterality: N/A;  AM   EYE SURGERY Bilateral    Foot surgery Left    Plantars wart   LIVER BIOPSY N/A 01/16/2019   Procedure: LIVER BIOPSY;  Surgeon: Kallie Manuelita BROCKS, MD;  Location: [facility];  Service: General;  Laterality: N/A;   WISDOM TOOTH EXTRACTION     Family History  Problem Relation Age of Onset   GER disease Mother    Hyperlipidemia Father    High Cholesterol Father    Healthy Sister     Current Outpatient Medications:    clindamycin (CLEOCIN T) 1 % lotion, APPLY AS DIRECTED EXTERNALLY PEA SIZE AMOUNT TO WHOLE FACE IN THE MORNING 30 DAYS EXTERNAL for 30, Disp: , Rfl:    drospirenone -ethinyl estradiol  (YASMIN ) 3-0.03 MG tablet, Take 1 tablet by mouth daily., Disp: 84 tablet, Rfl: 3   FLUoxetine  (PROZAC ) 20 MG capsule, Take 1 capsule (20 mg total) by mouth daily., Disp: 90 capsule, Rfl: 3   fluticasone  (FLONASE ) 50 MCG/ACT nasal spray, Place 2 sprays into both nostrils daily., Disp: 16 g, Rfl: 6   hydroxypropyl methylcellulose / hypromellose (ISOPTO TEARS / GONIOVISC) 2.5 % ophthalmic solution, Place 1 drop into both eyes 2 (two) times daily., Disp: , Rfl:    ketoconazole (NIZORAL) 2 % shampoo, Apply 1 application  topically 2 (two) times a week., Disp: , Rfl:    metroNIDAZOLE (METROCREAM) 0.75 % cream, Apply 1 application topically at bedtime., Disp: , Rfl:    montelukast  (SINGULAIR ) 10 MG tablet, Take 1 tablet (10 mg total) by mouth at bedtime., Disp: 90 tablet, Rfl: 3    tretinoin  (RETIN-A ) 0.025 % cream, Apply 1 application topically at bedtime. , Disp: , Rfl:   Allergies  Allergen Reactions   Hydrocodone  Itching   Oxycodone Itching     ROS: Review of Systems Pertinent items noted in HPI and remainder of comprehensive ROS otherwise negative.    Physical exam BP 115/79   Pulse 88   Temp 98.2 F (36.8 C)   Ht 5' 8 (1.727 m)   Wt 164 lb 6 oz (74.6 kg)   SpO2 95%   BMI 24.99 kg/m  General appearance: alert, cooperative, appears stated age, and no distress Head: Normocephalic, without obvious abnormality, atraumatic Eyes: negative findings: lids and lashes normal, conjunctivae and sclerae normal, corneas clear, and pupils equal, round, reactive to light and accomodation Ears: normal TM's and external ear canals both ears Nose: Nares normal. Septum midline. Mucosa normal. No drainage or sinus tenderness. Throat: lips, mucosa, and tongue normal; teeth and gums normal Neck: no adenopathy, no carotid bruit, supple, symmetrical, trachea midline, and thyroid  not enlarged, symmetric, no tenderness/mass/nodules Back: symmetric, no curvature. ROM normal. No CVA tenderness. Lungs: clear to auscultation bilaterally Heart: regular  rate and rhythm, S1, S2 normal, no murmur, click, rub or gallop Abdomen: soft, non-tender; bowel sounds normal; no masses,  no organomegaly Extremities: extremities normal, atraumatic, no cyanosis or edema Pulses: 2+ and symmetric Skin: small flesh colored Papule noted along the left ala of the nose Lymph nodes: Cervical, supraclavicular, and axillary nodes normal. Neurologic: Alert and oriented X 3, normal strength and tone. Normal symmetric reflexes. Normal coordination and gait      09/27/2023    9:17 AM 05/05/2023    8:24 AM 10/27/2022    9:15 AM  Depression screen PHQ 2/9  Decreased Interest 0 0 0  Down, Depressed, Hopeless 0 0 0  PHQ - 2 Score 0 0 0  Altered sleeping 0 0 0  Tired, decreased energy 0 0 0  Change in  appetite 0 0 0  Feeling bad or failure about yourself  0 0 0  Trouble concentrating 0 0 0  Moving slowly or fidgety/restless 0 0 0  Suicidal thoughts 0 0 0  PHQ-9 Score 0 0 0  Difficult doing work/chores Not difficult at all Not difficult at all Not difficult at all      09/27/2023    9:17 AM 05/05/2023    8:24 AM 10/27/2022    9:16 AM 08/25/2021    8:28 AM  GAD 7 : Generalized Anxiety Score  Nervous, Anxious, on Edge 0 0 0 0  Control/stop worrying 0 0 0 0  Worry too much - different things 0 0 0 0  Trouble relaxing 0 0 0 0  Restless 0 0 0 0  Easily annoyed or irritable 0 0 0 0  Afraid - awful might happen 0 0 0 0  Total GAD 7 Score 0 0 0 0  Anxiety Difficulty Not difficult at all Not difficult at all Not difficult at all Not difficult at all     Assessment/ Plan: Brianna Bridges here for annual physical exam.   Annual physical exam  Seasonal allergic rhinitis due to pollen - Plan: montelukast  (SINGULAIR ) 10 MG tablet    Allergic rhinitis well-controlled with Singulair . Significant improvement noted, Flonase  no longer needed. - Send Singulair  prescription to [facility]. - Discontinue Flonase .    Handout on healthy lifestyle choices, including diet (rich in fruits, vegetables and lean meats and low in salt and simple carbohydrates) and exercise (at least 30 minutes of moderate physical activity daily).  Patient to follow up 1 year for CPE  Lewanda Perea M. Jolinda, DO

## 2023-09-27 NOTE — Progress Notes (Deleted)
 Brianna Bridges is a 28 y.o. female presents to office today for annual physical exam examination.    Concerns today include: 1. ***  Occupation: ***, Marital status: ***, Substance use: *** Health Maintenance Due  Topic Date Due   COVID-19 Vaccine (1) Never done   INFLUENZA VACCINE  09/08/2023   Refills needed today: ***  Immunization History  Administered Date(s) Administered   DTaP 04/15/1996, 06/17/1996, 08/19/1996, 05/14/1997, 04/12/2001   HIB (PRP-OMP) 04/15/1996, 06/17/1996, 08/19/1996, 02/21/1997   HPV Quadrivalent 05/23/2011, 01/17/2012, 05/15/2012   Hepatitis A 05/23/2011, 01/17/2012   Hepatitis A, Adult 05/23/2011, 01/17/2012   Hepatitis B May 25, 1996, 03/18/1996, 08/19/1996   Hepatitis B, ADULT 09/18/2015   Hepatitis B, PED/ADOLESCENT 05-01-1996, 03/18/1996, 08/19/1996, 01/02/2015, 04/14/2015   Hpv-Unspecified 05/23/2011, 01/17/2012, 05/15/2012   IPV 04/15/1996, 06/17/1996, 08/19/1996, 04/12/2001   Influenza,inj,Quad PF,6+ Mos 12/08/2017, 12/04/2019   Influenza-Unspecified 10/08/2021   MMR 05/14/1997, 04/12/2001   Meningococcal Conjugate 04/17/2012   PPD Test 06/15/2015, 06/27/2016, 09/18/2017   Td 09/11/2007, 09/18/2017   Tdap 09/11/2007, 09/18/2017   Past Medical History:  Diagnosis Date   Abdominal pain, chronic, right lower quadrant 01/29/2019   Abnormal LFTs 11/05/2018   Food intolerance    GERD (gastroesophageal reflux disease)    Hepatitis A    IBS (irritable bowel syndrome)    Polycystic ovary syndrome    Rectal bleeding 04/23/2020   Recurrent tonsillitis 05/24/2017   Social History   Socioeconomic History   Marital status: Single    Spouse name: Not on file   Number of children: Not on file   Years of education: Not on file   Highest education level: Not on file  Occupational History   Not on file  Tobacco Use   Smoking status: Never    Passive exposure: Never   Smokeless tobacco: Never  Vaping Use   Vaping status: Never Used   Substance and Sexual Activity   Alcohol use: Not Currently   Drug use: No   Sexual activity: Yes    Birth control/protection: Pill  Other Topics Concern   Not on file  Social History Narrative   Not on file   Social Drivers of Health   Financial Resource Strain: Not on file  Food Insecurity: Not on file  Transportation Needs: Not on file  Physical Activity: Not on file  Stress: Not on file  Social Connections: Not on file  Intimate Partner Violence: Not on file   Past Surgical History:  Procedure Laterality Date   BIOPSY  05/06/2020   Procedure: BIOPSY;  Surgeon: Golda Claudis PENNER, MD;  [facility]: [facility];  Service: Endoscopy;;  duodenal, random colon   CHOLECYSTECTOMY N/A 01/16/2019   Procedure: LAPAROSCOPIC CHOLECYSTECTOMY;  Surgeon: Kallie Manuelita BROCKS, MD;  Location: [facility];  Service: General;  Laterality: N/A;   COLONOSCOPY WITH PROPOFOL  N/A 05/06/2020   Procedure: COLONOSCOPY WITH PROPOFOL ;  Surgeon: Golda Claudis PENNER, MD;  Location: [facility];  Service: Endoscopy;  Laterality: N/A;   ESOPHAGOGASTRODUODENOSCOPY (EGD) WITH PROPOFOL  N/A 05/06/2020   Procedure: ESOPHAGOGASTRODUODENOSCOPY (EGD) WITH PROPOFOL ;  Surgeon: Golda Claudis PENNER, MD;  [facility]: [facility];  Service: Endoscopy;  Laterality: N/A;  AM   EYE SURGERY Bilateral    Foot surgery Left    Plantars wart   LIVER BIOPSY N/A 01/16/2019   Procedure: LIVER BIOPSY;  Surgeon: Kallie Manuelita BROCKS, MD;  Location: [facility];  Service: General;  Laterality: N/A;   WISDOM TOOTH EXTRACTION     Family History  Problem Relation  Age of Onset   GER disease Mother    Hyperlipidemia Father    High Cholesterol Father    Healthy Sister     Current Outpatient Medications:    clindamycin (CLEOCIN T) 1 % lotion, APPLY AS DIRECTED EXTERNALLY PEA SIZE AMOUNT TO WHOLE FACE IN THE MORNING 30 DAYS EXTERNAL for 30, Disp: , Rfl:    drospirenone -ethinyl estradiol  (YASMIN ) 3-0.03 MG tablet, Take 1 tablet by mouth daily., Disp: 84  tablet, Rfl: 3   FLUoxetine  (PROZAC ) 20 MG capsule, Take 1 capsule (20 mg total) by mouth daily., Disp: 90 capsule, Rfl: 3   fluticasone  (FLONASE ) 50 MCG/ACT nasal spray, Place 2 sprays into both nostrils daily., Disp: 16 g, Rfl: 6   hydroxypropyl methylcellulose / hypromellose (ISOPTO TEARS / GONIOVISC) 2.5 % ophthalmic solution, Place 1 drop into both eyes 2 (two) times daily., Disp: , Rfl:    ketoconazole (NIZORAL) 2 % shampoo, Apply 1 application  topically 2 (two) times a week., Disp: , Rfl:    metroNIDAZOLE (METROCREAM) 0.75 % cream, Apply 1 application topically at bedtime., Disp: , Rfl:    montelukast  (SINGULAIR ) 10 MG tablet, Take 1 tablet (10 mg total) by mouth at bedtime., Disp: 90 tablet, Rfl: 3   tretinoin  (RETIN-A ) 0.025 % cream, Apply 1 application topically at bedtime. , Disp: , Rfl:   Allergies  Allergen Reactions   Hydrocodone  Itching   Oxycodone Itching     ROS: Review of Systems {ros; complete:30496}    Physical exam {Exam, Complete:[id number]}      05/05/2023    8:24 AM 10/27/2022    9:15 AM 01/19/2022    3:54 PM  Depression screen PHQ 2/9  Decreased Interest 0 0 0  Down, Depressed, Hopeless 0 0 0  PHQ - 2 Score 0 0 0  Altered sleeping 0 0   Tired, decreased energy 0 0   Change in appetite 0 0   Feeling bad or failure about yourself  0 0   Trouble concentrating 0 0   Moving slowly or fidgety/restless 0 0   Suicidal thoughts 0 0   PHQ-9 Score 0 0   Difficult doing work/chores Not difficult at all Not difficult at all       05/05/2023    8:24 AM 10/27/2022    9:16 AM 08/25/2021    8:28 AM  GAD 7 : Generalized Anxiety Score  Nervous, Anxious, on Edge 0 0 0  Control/stop worrying 0 0 0  Worry too much - different things 0 0 0  Trouble relaxing 0 0 0  Restless 0 0 0  Easily annoyed or irritable 0 0 0  Afraid - awful might happen 0 0 0  Total GAD 7 Score 0 0 0  Anxiety Difficulty Not difficult at all Not difficult at all Not difficult at all      Assessment/ Plan: Brianna Bridges here for annual physical exam.   Annual physical exam  Seasonal allergic rhinitis due to pollen  ***  Counseled on healthy lifestyle choices, including diet (rich in fruits, vegetables and lean meats and low in salt and simple carbohydrates) and exercise (at least 30 minutes of moderate physical activity daily).  Patient to follow up ***  Malita Ignasiak M. Jolinda, DO

## 2023-10-09 ENCOUNTER — Other Ambulatory Visit (HOSPITAL_COMMUNITY): Payer: Self-pay

## 2023-10-10 ENCOUNTER — Other Ambulatory Visit: Payer: Self-pay

## 2023-10-17 ENCOUNTER — Other Ambulatory Visit: Payer: Self-pay

## 2023-10-17 ENCOUNTER — Other Ambulatory Visit (HOSPITAL_COMMUNITY): Payer: Self-pay

## 2023-10-21 ENCOUNTER — Other Ambulatory Visit (HOSPITAL_COMMUNITY): Payer: Self-pay

## 2023-10-21 MED ORDER — DROSPIRENONE-ETHINYL ESTRADIOL 3-0.03 MG PO TABS
ORAL_TABLET | Freq: Every day | ORAL | 3 refills | Status: AC
  Filled 2023-10-21 – 2023-11-15 (×3): qty 84, 84d supply, fill #0
  Filled 2023-11-15: qty 28, 28d supply, fill #0

## 2023-11-15 ENCOUNTER — Other Ambulatory Visit (HOSPITAL_BASED_OUTPATIENT_CLINIC_OR_DEPARTMENT_OTHER): Payer: Self-pay

## 2023-11-15 ENCOUNTER — Other Ambulatory Visit (HOSPITAL_COMMUNITY): Payer: Self-pay

## 2023-12-06 ENCOUNTER — Other Ambulatory Visit: Payer: Self-pay | Admitting: Family Medicine

## 2023-12-06 DIAGNOSIS — L700 Acne vulgaris: Secondary | ICD-10-CM

## 2023-12-06 MED ORDER — CLINDAMYCIN PHOSPHATE 1 % EX LOTN: TOPICAL_LOTION | Freq: Two times a day (BID) | CUTANEOUS | 3 refills | Status: AC

## 2023-12-11 ENCOUNTER — Other Ambulatory Visit: Payer: Self-pay | Admitting: Family Medicine

## 2023-12-11 ENCOUNTER — Encounter: Payer: Self-pay | Admitting: Nurse Practitioner

## 2023-12-11 ENCOUNTER — Ambulatory Visit: Admitting: Nurse Practitioner

## 2023-12-11 ENCOUNTER — Ambulatory Visit: Payer: Self-pay | Admitting: Nurse Practitioner

## 2023-12-11 VITALS — BP 128/84 | HR 78 | Temp 98.8°F | Ht 68.0 in | Wt 171.6 lb

## 2023-12-11 DIAGNOSIS — J029 Acute pharyngitis, unspecified: Secondary | ICD-10-CM | POA: Diagnosis not present

## 2023-12-11 LAB — CULTURE, GROUP A STREP

## 2023-12-11 LAB — RAPID STREP SCREEN (MED CTR MEBANE ONLY): Strep Gp A Ag, IA W/Reflex: NEGATIVE

## 2023-12-11 LAB — VERITOR SARS-COV-2 AND FLU A+B
BD Veritor SARS-CoV-2 Ag: NEGATIVE
Influenza A: NEGATIVE
Influenza B: NEGATIVE

## 2023-12-11 NOTE — Progress Notes (Signed)
 Subjective:    Patient ID: Brianna Bridges, female    DOB: [birth year], 28 y.o.   MRN: [id number]   Chief Complaint: sore throat, cough and  congestion  Had sore thorat 2 weeks ago with sores on back of her throat. Sh ehad amoxicillin  500 BI at home so she took it all. Got better and started again Saturday. She says that she get sstrep often.  URI  This is a new problem. The current episode started in the past 7 days. The problem has been waxing and waning. There has been no fever. Associated symptoms include congestion, coughing, rhinorrhea and a sore throat. Pertinent negatives include no abdominal pain, chest pain, headaches or rash. She has tried nothing for the symptoms. The treatment provided no relief.    Patient Active Problem List   Diagnosis Date Noted   Anal fissure 04/27/2021   GERD (gastroesophageal reflux disease) 10/22/2019   Anal skin tag 03/04/2019   IBS (irritable bowel syndrome)    Calculus of gallbladder without cholecystitis without obstruction 12/04/2018       Review of Systems  Constitutional:  Negative for diaphoresis.  HENT:  Positive for congestion, rhinorrhea and sore throat.   Eyes:  Negative for pain.  Respiratory:  Positive for cough. Negative for shortness of breath.   Cardiovascular:  Negative for chest pain, palpitations and leg swelling.  Gastrointestinal:  Negative for abdominal pain.  Endocrine: Negative for polydipsia.  Skin:  Negative for rash.  Neurological:  Negative for dizziness, weakness and headaches.  Hematological:  Does not bruise/bleed easily.  All other systems reviewed and are negative.      Objective:   Physical Exam Constitutional:      Appearance: Normal appearance.  HENT:     Right Ear: Tympanic membrane normal.     Left Ear: Tympanic membrane normal.     Nose: Congestion and rhinorrhea present.     Mouth/Throat:     Pharynx: Posterior oropharyngeal erythema present. No oropharyngeal exudate.  Cardiovascular:      Rate and Rhythm: Normal rate and regular rhythm.     Heart sounds: Normal heart sounds.  Pulmonary:     Effort: Pulmonary effort is normal.     Breath sounds: Normal breath sounds.  Skin:    General: Skin is warm.  Neurological:     General: No focal deficit present.     Mental Status: She is alert and oriented to person, place, and time.  Psychiatric:        Mood and Affect: Mood normal.        Behavior: Behavior normal.    BP 128/84   Pulse 78   Temp 98.8 F (37.1 C)   Ht 5' 8 (1.727 m)   Wt 171 lb 9.6 oz (77.8 kg)   SpO2 97%   BMI 26.09 kg/m   Rapid flu negative Ravid covid negative Strep negative      Assessment & Plan:   Brianna Bridges in today with chief complaint of Sore Throat (Cough congestion)   1. Sore throat (Primary) - Rapid Strep Screen ([facility] ONLY); Future - Rapid Strep Screen ([facility] ONLY)  2. Viral pharyngitis Force fluids Motrin or tylenol  OTC OTC decongestant Throat lozenges if help New toothbrush in 3 days     The above assessment and management plan was discussed with the patient. The patient verbalized understanding of and has agreed to the management plan. Patient is aware to call the clinic if symptoms  persist or worsen. Patient is aware when to return to the clinic for a follow-up visit. Patient educated on when it is appropriate to go to the emergency department.   Mary-Margaret Gladis, FNP

## 2023-12-11 NOTE — Patient Instructions (Signed)

## 2023-12-12 ENCOUNTER — Ambulatory Visit: Payer: Self-pay | Admitting: Family Medicine

## 2024-02-09 ENCOUNTER — Other Ambulatory Visit (HOSPITAL_COMMUNITY): Payer: Self-pay

## 2024-02-23 ENCOUNTER — Other Ambulatory Visit (HOSPITAL_COMMUNITY): Payer: Self-pay

## 2024-05-28 ENCOUNTER — Ambulatory Visit: Admitting: Dermatology

## 2024-10-02 ENCOUNTER — Encounter: Payer: Self-pay | Admitting: Family Medicine
